# Patient Record
Sex: Female | Born: 1935 | Race: White | Hispanic: No | Smoking: Former smoker
Health system: Southern US, Community
[De-identification: ages and names within clinical notes are randomized; demographics above are authoritative.]

## PROBLEM LIST (undated history)

## (undated) DIAGNOSIS — E7800 Pure hypercholesterolemia, unspecified: Secondary | ICD-10-CM

## (undated) DIAGNOSIS — I10 Essential (primary) hypertension: Secondary | ICD-10-CM

## (undated) DIAGNOSIS — I4891 Unspecified atrial fibrillation: Secondary | ICD-10-CM

## (undated) HISTORY — PX: ANKLE SURGERY: SHX546

## (undated) HISTORY — PX: APPENDECTOMY: SHX54

## (undated) HISTORY — PX: CHOLECYSTECTOMY: SHX55

## (undated) HISTORY — PX: ABDOMINAL HYSTERECTOMY: SHX81

## (undated) HISTORY — PX: BACK SURGERY: SHX140

---

## 2000-02-28 ENCOUNTER — Ambulatory Visit (HOSPITAL_COMMUNITY): Admission: RE | Admit: 2000-02-28 | Discharge: 2000-02-28 | Payer: Self-pay | Admitting: Neurosurgery

## 2000-02-28 ENCOUNTER — Encounter: Payer: Self-pay | Admitting: Neurosurgery

## 2000-03-11 ENCOUNTER — Encounter: Payer: Self-pay | Admitting: Neurosurgery

## 2000-03-11 ENCOUNTER — Ambulatory Visit (HOSPITAL_COMMUNITY): Admission: RE | Admit: 2000-03-11 | Discharge: 2000-03-11 | Payer: Self-pay | Admitting: Neurosurgery

## 2000-03-25 ENCOUNTER — Ambulatory Visit (HOSPITAL_COMMUNITY): Admission: RE | Admit: 2000-03-25 | Discharge: 2000-03-25 | Payer: Self-pay | Admitting: Neurosurgery

## 2000-03-25 ENCOUNTER — Encounter: Payer: Self-pay | Admitting: Neurosurgery

## 2000-06-25 ENCOUNTER — Encounter: Payer: Self-pay | Admitting: Neurosurgery

## 2000-06-29 ENCOUNTER — Inpatient Hospital Stay (HOSPITAL_COMMUNITY): Admission: RE | Admit: 2000-06-29 | Discharge: 2000-06-30 | Payer: Self-pay | Admitting: Neurosurgery

## 2000-06-29 ENCOUNTER — Encounter: Payer: Self-pay | Admitting: Neurosurgery

## 2001-10-07 ENCOUNTER — Encounter: Admission: RE | Admit: 2001-10-07 | Discharge: 2001-11-03 | Payer: Self-pay | Admitting: Neurosurgery

## 2005-03-06 ENCOUNTER — Ambulatory Visit (HOSPITAL_COMMUNITY): Admission: RE | Admit: 2005-03-06 | Discharge: 2005-03-06 | Payer: Self-pay | Admitting: Neurosurgery

## 2008-03-13 ENCOUNTER — Emergency Department (HOSPITAL_COMMUNITY): Admission: EM | Admit: 2008-03-13 | Discharge: 2008-03-13 | Payer: Self-pay | Admitting: Emergency Medicine

## 2008-09-20 ENCOUNTER — Ambulatory Visit (HOSPITAL_COMMUNITY): Admission: RE | Admit: 2008-09-20 | Discharge: 2008-09-20 | Payer: Self-pay | Admitting: Internal Medicine

## 2010-06-10 LAB — POCT I-STAT, CHEM 8
BUN: 31 mg/dL — ABNORMAL HIGH (ref 6–23)
Calcium, Ion: 1.2 mmol/L (ref 1.12–1.32)
Chloride: 103 meq/L (ref 96–112)
Creatinine, Ser: 1 mg/dL (ref 0.4–1.2)
Glucose, Bld: 120 mg/dL — ABNORMAL HIGH (ref 70–99)
HCT: 40 % (ref 36.0–46.0)
Hemoglobin: 13.6 g/dL (ref 12.0–15.0)
Potassium: 4 meq/L (ref 3.5–5.1)
Sodium: 139 meq/L (ref 135–145)
TCO2: 29 mmol/L (ref 0–100)

## 2010-06-10 LAB — URINE MICROSCOPIC-ADD ON

## 2010-06-10 LAB — URINALYSIS, ROUTINE W REFLEX MICROSCOPIC
Bilirubin Urine: NEGATIVE
Glucose, UA: NEGATIVE mg/dL
Hgb urine dipstick: NEGATIVE
Ketones, ur: NEGATIVE mg/dL
Nitrite: NEGATIVE
Protein, ur: NEGATIVE mg/dL
Specific Gravity, Urine: 1.013 (ref 1.005–1.030)
Urobilinogen, UA: 0.2 mg/dL (ref 0.0–1.0)
pH: 6.5 (ref 5.0–8.0)

## 2010-06-10 LAB — CBC
Platelets: 347 10*3/uL (ref 150–400)
RBC: 4.35 MIL/uL (ref 3.87–5.11)
WBC: 9.3 10*3/uL (ref 4.0–10.5)

## 2010-06-10 LAB — DIFFERENTIAL
Basophils Absolute: 0.1 10*3/uL (ref 0.0–0.1)
Basophils Relative: 1 % (ref 0–1)
Eosinophils Absolute: 0.2 10*3/uL (ref 0.0–0.7)
Eosinophils Relative: 3 % (ref 0–5)
Lymphocytes Relative: 40 % (ref 12–46)
Lymphs Abs: 3.7 10*3/uL (ref 0.7–4.0)
Monocytes Absolute: 0.7 10*3/uL (ref 0.1–1.0)
Monocytes Relative: 8 % (ref 3–12)
Neutro Abs: 4.6 10*3/uL (ref 1.7–7.7)
Neutrophils Relative %: 50 % (ref 43–77)

## 2010-06-10 LAB — GLUCOSE, CAPILLARY: Glucose-Capillary: 132 mg/dL — ABNORMAL HIGH (ref 70–99)

## 2010-07-12 NOTE — H&P (Signed)
[facility]  Patient:    Penny Ramos, Penny Ramos                      MRN: [id number] Adm. Date:  [id number] Attending:  Barton Fanny                         History and Physical  HISTORY OF PRESENT ILLNESS:  The patient is a 77 year old right-handed white female whom I have followed for the past five months for neurogenic claudication secondary to lumbar stenosis.  Her symptoms began about eight months ago initially with left buttock and posterior thigh pain and it has extended to the buttocks and posterior thighs bilaterally.  She was treated with a number of measures including IM cortisone injections, a number of different NSAIDS including Vioxx and Celebrex that tended to upset her stomach even with the use of Prilosec.  She underwent a series of epidural steroid injections which gave her some transient relief but no lasting relief.  The pain has persisted and in fact has worsened, with pain into the low back, buttocks, posterior thighs, and calves, and at this point she finds it incapacitating.  She finds it difficult to sit for any extended period of time and difficult getting up from a seated position.  She cannot walk very far without pain incapacitating her and she can walk less than a block before pain starts radiating down to her lower extremities bilaterally, and she cannot walk more than two blocks without having to sit down and rest due to the claudication.  MRI scan shows significant stenosis at L4-5 although she does have degenerative disk disease and spondylosis throughout the lumbar spine. She is admitted now for an L4 and L5 lumbar laminectomy for decompression of her stenosis and relief of her claudication.  PAST MEDICAL HISTORY:  Notable for history of hypertension and hiatal hernia. There is no history, though, of myocardial infarction, cancer, stroke, diabetes, or lung disease.  PREVIOUS SURGERY:  Includes appendectomy in  1949, cholecystectomy in 1959, hysterectomy in 1971, two ankle surgeries in 1991 and a carpal tunnel release.  ALLERGIES:  She denies allergies to medications.  CURRENT MEDICATIONS: 1. Toprol-XL 100 mg q.d. 2. Zestoretic 20/25 one tablet p.o. q.d. 3. Premarin 0.625 mg q.d. 4. Lipitor 10 mg q.d. 5. Clorazepate 7.5 mg p.r.n. 6. Hydrocodone p.r.n. for pain.  FAMILY HISTORY:  Her parents have both passed on.  Her mother died at 46 from old age.  Her father died at age 68 of heart disease.  SOCIAL HISTORY:  The patient is married, she is retired.  She does not smoke. She does drink alcoholic beverages rarely.  She denies a history of substance abuse.  REVIEW OF SYSTEMS:  Notable for those difficulties described in her history of present illness and past medical history but is otherwise unremarkable.  PHYSICAL EXAMINATION:  GENERAL:  The patient is a well-developed, well-nourished white female in no acute distress.  VITAL SIGNS:  Her height is 5 feet 5.5 inches, weight 174 pounds.  She is afebrile.  LUNGS:  Clear to auscultation.  She has symmetrical respiratory excursion.  HEART:  Has a regular rate and rhythm, normal S1, S2.  There is no murmur.  ABDOMEN:  Soft, nondistended.  Bowel sounds are present.  EXTREMITIES:  Show no clubbing, cyanosis, or edema.  MUSCULOSKELETAL:  Shows mild tenderness to palpation in the lower lumbar region diffusely without any specific  point tenderness.  She is able to flex 90 degrees, able to extend fairly well.  Straight leg raising is negative bilaterally.  NEUROLOGIC:  Shows 5/5 strength through the distal left lower extremity including dorsiflexion, plantar flexion, extensor hallucis longus; however, she has giveaway weakness of the right dorsiflexion and plantar flexor due to her previous ankle surgery.  Her right extensor hallucis longus is 5/5. Sensation is intact to pinprick throughout the lower extremities.  Reflexes of the  quadriceps are 3 bilaterally; gastrocnemius on the left is absent, on the right is 1.  The toes are downgoing bilaterally.  She has a normal gait and stance although she cannot sit for an extended period of time.  IMPRESSION:  Patient with neurogenic claudication secondary to spinal stenosis, multifactorial in nature, at the L4-5 level with degenerative disk disease and spondylosis present throughout the lumbar spine.  PLAN:  The patient to be admitted for an L4 and L5 lumbar laminectomy.  We discussed the nature of surgery on several occasions; typical length of surgery, hospital stay, and overall recuperation; her limitations during the postoperative period; and alternatives to surgery.  We also discussed risks of surgery including risk of infection, bleeding, possible need for transfusion; the risk of nerve root dysfunction with pain, weakness, numbness, or paresthesias; the risk of dural tear and CSF leakage; the possible need for further surgery; and anesthetic risks of myocardial infarction, stroke, pneumonia, and death.  Understanding all this she does wish to proceed with surgery and is admitted for such. DD:  06/29/00 TD:  06/29/00 Job: [id number]

## 2010-07-12 NOTE — Op Note (Signed)
[place]  Patient:    Penny Ramos, Penny Ramos                      MRN: [id number] Proc. Date: 06/29/00 Adm. Date:  [id number] Attending:  Barton Fanny                           Operative Report  PREOPERATIVE DIAGNOSIS:  Lumbar stenosis.  POSTOPERATIVE DIAGNOSIS:  Lumbar stenosis.  PROCEDURE:  L4 and L5 lumbar laminectomy.  SURGEON:  Hewitt Shorts, M.D.  ASSISTANT:  Danae Orleans. Venetia Maxon, M.D.  ANESTHESIA:  General endotracheal.  INDICATION:  The patient is a 77 year old woman who presented with neurogenic claudication.  She had been treated with a number of measures, including NSAIDs, steroids, epidural steroid injections, and so on, without lasting relief, and a decision was made to proceed with a multilevel lumbar laminectomy.  DESCRIPTION OF PROCEDURE:  Patient brought to the operating room and placed under general endotracheal anesthesia.  The patient was turned to a prone position.  The lumbar region was prepped with Duraprep and draped in a sterile fashion.  The midline was infiltrated with local anesthetic with epinephrine and a localizing x-ray taken and the L5 level identified.  A midline incision was made, carried down through the subcutaneous tissue.  Bipolar cautery and electrocautery were used to maintain hemostasis.  Dissection was carried down through the lumbar fascia, which was incised bilaterally, and the paraspinal muscles were dissected from the spinous processes and laminae in subperiosteal fashion.  Another x-ray was taken, the L4-5 interlaminar space was identified, and then we proceeded with a multilevel lumbar laminectomy, removing the spinous processes and laminae of L4 and L5 using double-action rongeurs with Black Max drill and Kerrison punches.  Ligamentum flavum was markedly thickened, and we were able to decompress the thecal sac and the nerve roots within the foramina, and we were able to confirm  decompression of the L4, L5, and S1 nerve roots bilaterally.  Once the decompression was completed, hemostasis was established with the use of Gelfoam soaked in thrombin, and then the wound was irrigated extensively with bacitracin solution and then closed.  The paraspinal muscles were approximated with interrupted undyed 1 Vicryl sutures, the deep fascia closed with interrupted undyed 1 Vicryl sutures, and the subcutaneous and subcuticular closure with interrupted, inverted 2-0 undyed Vicryl sutures.  Skin was reapproximated with Dermabond. The patient tolerated the procedure well.  The estimated blood loss was less than 100 cc.  Sponge and needle count were correct.  Following surgery, the patient was turned back to supine position, to be reversed from the anesthetic, extubated, and transferred to the recovery room for further care. DD:  06/29/00 TD:  06/30/00 Job: [id number]

## 2011-03-10 ENCOUNTER — Other Ambulatory Visit: Payer: Self-pay | Admitting: Internal Medicine

## 2011-03-10 ENCOUNTER — Ambulatory Visit
Admission: RE | Admit: 2011-03-10 | Discharge: 2011-03-10 | Disposition: A | Discharge status: Home / Self Care | Payer: Medicare Other | Source: Ambulatory Visit | Attending: Internal Medicine | Admitting: Internal Medicine

## 2011-03-10 DIAGNOSIS — R52 Pain, unspecified: Secondary | ICD-10-CM

## 2012-11-29 ENCOUNTER — Encounter (HOSPITAL_COMMUNITY): Payer: Self-pay | Admitting: *Deleted

## 2012-11-29 ENCOUNTER — Emergency Department (HOSPITAL_COMMUNITY)
Admission: EM | Admit: 2012-11-29 | Discharge: 2012-11-29 | Disposition: A | Discharge status: Home / Self Care | Payer: Medicare Other | Attending: Emergency Medicine | Admitting: Emergency Medicine

## 2012-11-29 ENCOUNTER — Emergency Department (HOSPITAL_COMMUNITY): Payer: Medicare Other

## 2012-11-29 DIAGNOSIS — Z87891 Personal history of nicotine dependence: Secondary | ICD-10-CM | POA: Insufficient documentation

## 2012-11-29 DIAGNOSIS — E785 Hyperlipidemia, unspecified: Secondary | ICD-10-CM | POA: Insufficient documentation

## 2012-11-29 DIAGNOSIS — R002 Palpitations: Secondary | ICD-10-CM | POA: Insufficient documentation

## 2012-11-29 DIAGNOSIS — Z79899 Other long term (current) drug therapy: Secondary | ICD-10-CM | POA: Insufficient documentation

## 2012-11-29 DIAGNOSIS — I10 Essential (primary) hypertension: Secondary | ICD-10-CM | POA: Insufficient documentation

## 2012-11-29 HISTORY — DX: Essential (primary) hypertension: I10

## 2012-11-29 LAB — CBC
Hemoglobin: 13.8 g/dL (ref 12.0–15.0)
MCHC: 33.3 g/dL (ref 30.0–36.0)
WBC: 6.8 10*3/uL (ref 4.0–10.5)

## 2012-11-29 LAB — PRO B NATRIURETIC PEPTIDE: Pro B Natriuretic peptide (BNP): 272 pg/mL (ref 0–450)

## 2012-11-29 LAB — BASIC METABOLIC PANEL
Chloride: 102 mEq/L (ref 96–112)
GFR calc Af Amer: 71 mL/min — ABNORMAL LOW (ref >90)
GFR calc non Af Amer: 62 mL/min — ABNORMAL LOW (ref >90)
Glucose, Bld: 105 mg/dL — ABNORMAL HIGH (ref 70–99)
Potassium: 3.4 mEq/L — ABNORMAL LOW (ref 3.5–5.1)
Sodium: 142 mEq/L (ref 135–145)

## 2012-11-29 LAB — POCT I-STAT TROPONIN I

## 2012-11-29 MED ORDER — POTASSIUM CHLORIDE CRYS ER 20 MEQ PO TBCR
20.0000 meq | EXTENDED_RELEASE_TABLET | Freq: Once | ORAL | Status: AC
  Administered 2012-11-29: 20 meq via ORAL
  Filled 2012-11-29: qty 1

## 2012-11-29 NOTE — ED Notes (Signed)
Pt ambulated with assistance from her cane and myself to the bathroom and back to her room; family at bedside; pt placed back on monitor, continuous pulse oximetry and blood pressure cuff; PA is now at bedside

## 2012-11-29 NOTE — ED Notes (Signed)
Pt reports being woken this morning with feeling of palpatations in the center of her chest.  This episode lasted approx 15 min then stopped. Pt has had another episode of same on Saturday.  Pt alert oriented X4

## 2012-11-29 NOTE — ED Notes (Signed)
"  my heart runs, sometimes it beats and sometimes it races."  Denies chest pain.  Shortness of breathe with "alot of walking."  Leg pain- both thighs- "I am seeing an orthopedic for this."

## 2012-11-29 NOTE — ED Notes (Signed)
Patient transported to X-ray 

## 2012-11-29 NOTE — ED Notes (Signed)
Pt states at 0500 today was woke with palpitations that scared her.  No sob.  Pt had some weakness on Saturday

## 2012-11-29 NOTE — ED Provider Notes (Signed)
Medical screening examination/treatment/procedure(s) were conducted as a shared visit with non-physician practitioner(s) and myself.  I personally evaluated the patient during the encounter  Patient seen and examined. Describes that she has had trouble with shortness of breath with ambulation for quite some time. Denies any chest pain or CHF type symptoms. No syncope or near syncope with her palpitations. Denies any recent vomiting or fever. Labs reviewed here. She is stable for discharge  Toy Baker, MD 11/29/12 1323

## 2012-11-29 NOTE — ED Provider Notes (Signed)
CSN: [id number]     Arrival date & time 11/29/12  0909 History   First MD Initiated Contact with Patient 11/29/12 1107     Chief Complaint  Patient presents with  . Palpitations   (Consider location/radiation/quality/duration/timing/severity/associated sxs/prior Treatment) HPI Comments: Patient is a 79 yo F PMHx significant for HTN, HLD presenting to the ED for two episodes of palpitations that began this morning around 5AM. Patient states she woke up to go to the restroom and after ambulating began to feel like her "heart was racing." Patient states she sat down and the palpitations passed after about 10 minutes. Patient states she had one more episode of palpitations that began with exertion and resolved after 10 minutes of rest. Patient denies having any CP, SOB, nausea, vomiting, diaphoresis with the palpitations. The only associated symptom the patient endorses is feeling very anxious after the onset of palpitations Patient states that this weekend she had new DOE with walking from her garage into her house. Patient denies ever experiencing this before, but staets she is not very ambulatory d/t lower extremity arthritis. Patient walks with cane normally. Patient never had a cardiac catheterization, echo, stress test.  Patient is a 79 y.o. female presenting with palpitations.  Palpitations Associated symptoms: no nausea, no shortness of breath and no vomiting     Past Medical History  Diagnosis Date  . Hypertension    Past Surgical History  Procedure Laterality Date  . Cholecystectomy    . Abdominal hysterectomy    . Back surgery    . Ankle surgery    . Appendectomy     No family history on file. History  Substance Use Topics  . Smoking status: Former Games developer  . Smokeless tobacco: Not on file  . Alcohol Use: No   OB History   Grav Para Term Preterm Abortions TAB SAB Ect Mult Living                 Review of Systems  Constitutional: Negative for fever.  HENT: Negative.    Eyes: Negative.   Respiratory: Negative for shortness of breath.   Cardiovascular: Positive for palpitations.  Gastrointestinal: Negative for nausea, vomiting and abdominal pain.  Genitourinary: Negative.   Musculoskeletal: Negative.   Skin: Negative.   Neurological: Negative for syncope and headaches.    Allergies  Nsaids  Home Medications   Current Outpatient Rx  Name  Route  Sig  Dispense  Refill  . acetaminophen (TYLENOL) 500 MG tablet   Oral   Take 1,000 mg by mouth every 6 (six) hours as needed for pain.         Marland Kitchen atorvastatin (LIPITOR) 10 MG tablet   Oral   Take 10 mg by mouth daily.         . clorazepate (TRANXENE) 7.5 MG tablet   Oral   Take 7.5 mg by mouth daily as needed.         Marland Kitchen lisinopril-hydrochlorothiazide (PRINZIDE,ZESTORETIC) 20-25 MG per tablet   Oral   Take 1 tablet by mouth daily.         . metoprolol succinate (TOPROL-XL) 100 MG 24 hr tablet   Oral   Take 100 mg by mouth daily. Take with or immediately following a meal.         . traMADol (ULTRAM) 50 MG tablet   Oral   Take 50 mg by mouth every 6 (six) hours as needed for pain.          BP 156/88  Pulse 79  Temp(Src) 97.4 F (36.3 C) (Oral)  Resp 23  SpO2 96% Physical Exam  Constitutional: She is oriented to person, place, and time. She appears well-developed and well-nourished. No distress.  HENT:  Head: Normocephalic and atraumatic.  Right Ear: External ear normal.  Left Ear: External ear normal.  Nose: Nose normal.  Mouth/Throat: Oropharynx is clear and moist.  Eyes: Conjunctivae are normal. Pupils are equal, round, and reactive to light.  Neck: Normal range of motion. Neck supple.  Cardiovascular: Normal rate, regular rhythm, normal heart sounds and intact distal pulses.   No murmur heard. No carotid bruits   Pulmonary/Chest: Effort normal and breath sounds normal. No respiratory distress. She exhibits no tenderness.  Abdominal: Soft. Bowel sounds are normal. She  exhibits no distension. There is no tenderness. There is no rebound.  Musculoskeletal: Normal range of motion. She exhibits no edema and no tenderness.  Lymphadenopathy:    She has no cervical adenopathy.  Neurological: She is alert and oriented to person, place, and time.  Skin: Skin is warm and dry. She is not diaphoretic.    ED Course  Procedures (including critical care time) Labs Review Labs Reviewed  BASIC METABOLIC PANEL - Abnormal; Notable for the following:    Potassium 3.4 (*)    Glucose, Bld 105 (*)    BUN 24 (*)    GFR calc non Af Amer 62 (*)    GFR calc Af Amer 71 (*)    All other components within normal limits  CBC  PRO B NATRIURETIC PEPTIDE  POCT I-STAT TROPONIN I    Date: 11/29/2012  Rate: 77  Rhythm: normal sinus rhythm and premature atrial contractions (PAC)  QRS Axis: normal  Intervals: normal  ST/T Wave abnormalities: nonspecific ST/T changes  Conduction Disutrbances:none  Narrative Interpretation:   Old EKG Reviewed: none available   Imaging Review Dg Chest 2 View  11/29/2012   CLINICAL DATA:  Mid chest pain.  EXAM: CHEST  2 VIEW  COMPARISON:  None  FINDINGS: Normal cardiac and mediastinal contours. No consolidative pulmonary opacities. No pleural effusion pneumothorax. Mid thoracic spine degenerative change.  IMPRESSION: No acute cardiopulmonary process.   Electronically Signed   By: Annia Belt M.D.   On: 11/29/2012 12:59    MDM   1. Palpitations   2. Hypertension    Afebrile, NAD, non-toxic appearing, AAOx4. Patient presented with 2 episodes of palpitations without associated shortness of breath, chest pain, dizziness, lightheadedness, loss of consciousness. Patient has been asymptomatic since arriving in the emergency department. Physical exam is unremarkable. Lungs are clear to auscultation bilaterally. Regular heart rate and rhythm with intact distal pulses. Troponin negative. EKG without acute abnormality. I have personally reviewed all EKG,  laboratory and imaging results. At this time do not feel that there is any acute emergent cause of patient's palpitations. Advised patient to followup with primary care physician regarding today's visit. Advised patient to continue taking all home medications as prescribed. Return precautions discussed with patient. Patient is agreeable to plan. Patient is stable at time of discharge. Patient d/w with Dr. Freida Busman, agrees with plan.        Jeannetta Ellis, PA-C 11/29/12 1927

## 2012-11-30 NOTE — ED Provider Notes (Signed)
Medical screening examination/treatment/procedure(s) were conducted as a shared visit with non-physician practitioner(s) and myself.  I personally evaluated the patient during the encounter  Wataru Mccowen T Kebrina Friend, MD 11/30/12 2332 

## 2013-12-07 ENCOUNTER — Other Ambulatory Visit: Payer: Self-pay | Admitting: Internal Medicine

## 2013-12-07 ENCOUNTER — Ambulatory Visit
Admission: RE | Admit: 2013-12-07 | Discharge: 2013-12-07 | Disposition: A | Discharge status: Home / Self Care | Payer: Medicare Other | Source: Ambulatory Visit | Attending: Internal Medicine | Admitting: Internal Medicine

## 2013-12-07 DIAGNOSIS — M25521 Pain in right elbow: Secondary | ICD-10-CM

## 2013-12-27 ENCOUNTER — Ambulatory Visit: Payer: Medicare Other | Admitting: Physical Therapy

## 2014-10-09 ENCOUNTER — Emergency Department (HOSPITAL_COMMUNITY): Payer: Medicare Other

## 2014-10-09 ENCOUNTER — Encounter (HOSPITAL_COMMUNITY): Payer: Self-pay

## 2014-10-09 ENCOUNTER — Observation Stay (HOSPITAL_COMMUNITY)
Admission: EM | Admit: 2014-10-09 | Discharge: 2014-10-10 | Disposition: A | Discharge status: Home / Self Care | Payer: Medicare Other | Attending: Cardiology | Admitting: Cardiology

## 2014-10-09 DIAGNOSIS — N183 Chronic kidney disease, stage 3 (moderate): Secondary | ICD-10-CM | POA: Insufficient documentation

## 2014-10-09 DIAGNOSIS — E785 Hyperlipidemia, unspecified: Secondary | ICD-10-CM | POA: Insufficient documentation

## 2014-10-09 DIAGNOSIS — Z79899 Other long term (current) drug therapy: Secondary | ICD-10-CM | POA: Diagnosis not present

## 2014-10-09 DIAGNOSIS — I4891 Unspecified atrial fibrillation: Principal | ICD-10-CM | POA: Diagnosis present

## 2014-10-09 DIAGNOSIS — I129 Hypertensive chronic kidney disease with stage 1 through stage 4 chronic kidney disease, or unspecified chronic kidney disease: Secondary | ICD-10-CM | POA: Diagnosis not present

## 2014-10-09 DIAGNOSIS — I5031 Acute diastolic (congestive) heart failure: Secondary | ICD-10-CM | POA: Diagnosis not present

## 2014-10-09 DIAGNOSIS — K219 Gastro-esophageal reflux disease without esophagitis: Secondary | ICD-10-CM | POA: Diagnosis not present

## 2014-10-09 DIAGNOSIS — I10 Essential (primary) hypertension: Secondary | ICD-10-CM | POA: Diagnosis present

## 2014-10-09 DIAGNOSIS — Z87891 Personal history of nicotine dependence: Secondary | ICD-10-CM | POA: Diagnosis not present

## 2014-10-09 DIAGNOSIS — R739 Hyperglycemia, unspecified: Secondary | ICD-10-CM | POA: Insufficient documentation

## 2014-10-09 HISTORY — DX: Pure hypercholesterolemia, unspecified: E78.00

## 2014-10-09 LAB — BASIC METABOLIC PANEL
Anion gap: 13 (ref 5–15)
BUN: 31 mg/dL — AB (ref 6–20)
CHLORIDE: 105 mmol/L (ref 101–111)
CO2: 24 mmol/L (ref 22–32)
CREATININE: 1.04 mg/dL — AB (ref 0.44–1.00)
Calcium: 9.9 mg/dL (ref 8.9–10.3)
GFR calc Af Amer: 56 mL/min — ABNORMAL LOW (ref >60)
GFR calc non Af Amer: 48 mL/min — ABNORMAL LOW (ref >60)
GLUCOSE: 165 mg/dL — AB (ref 65–99)
POTASSIUM: 3.6 mmol/L (ref 3.5–5.1)
SODIUM: 142 mmol/L (ref 135–145)

## 2014-10-09 LAB — TROPONIN I
TROPONIN I: 0.03 ng/mL (ref <0.031)
Troponin I: 0.04 ng/mL — ABNORMAL HIGH (ref <0.031)
Troponin I: 0.04 ng/mL — ABNORMAL HIGH (ref <0.031)
Troponin I: 0.04 ng/mL — ABNORMAL HIGH (ref <0.031)

## 2014-10-09 LAB — CBC WITH DIFFERENTIAL/PLATELET
BASOS PCT: 0 % (ref 0–1)
Basophils Absolute: 0 10*3/uL (ref 0.0–0.1)
Eosinophils Absolute: 0.1 10*3/uL (ref 0.0–0.7)
Eosinophils Relative: 1 % (ref 0–5)
HEMATOCRIT: 39 % (ref 36.0–46.0)
HEMOGLOBIN: 12.3 g/dL (ref 12.0–15.0)
LYMPHS ABS: 2.7 10*3/uL (ref 0.7–4.0)
LYMPHS PCT: 29 % (ref 12–46)
MCH: 29.8 pg (ref 26.0–34.0)
MCHC: 31.5 g/dL (ref 30.0–36.0)
MCV: 94.4 fL (ref 78.0–100.0)
MONO ABS: 0.5 10*3/uL (ref 0.1–1.0)
MONOS PCT: 5 % (ref 3–12)
NEUTROS ABS: 5.9 10*3/uL (ref 1.7–7.7)
NEUTROS PCT: 65 % (ref 43–77)
Platelets: 344 10*3/uL (ref 150–400)
RBC: 4.13 MIL/uL (ref 3.87–5.11)
RDW: 12.5 % (ref 11.5–15.5)
WBC: 9.1 10*3/uL (ref 4.0–10.5)

## 2014-10-09 LAB — BRAIN NATRIURETIC PEPTIDE: B NATRIURETIC PEPTIDE 5: 332.9 pg/mL — AB (ref 0.0–100.0)

## 2014-10-09 LAB — URINALYSIS, ROUTINE W REFLEX MICROSCOPIC
Bilirubin Urine: NEGATIVE
GLUCOSE, UA: NEGATIVE mg/dL
Hgb urine dipstick: NEGATIVE
KETONES UR: NEGATIVE mg/dL
LEUKOCYTES UA: NEGATIVE
NITRITE: NEGATIVE
PH: 5 (ref 5.0–8.0)
Protein, ur: NEGATIVE mg/dL
SPECIFIC GRAVITY, URINE: 1.023 (ref 1.005–1.030)
Urobilinogen, UA: 0.2 mg/dL (ref 0.0–1.0)

## 2014-10-09 LAB — PROTIME-INR
INR: 0.97 (ref 0.00–1.49)
Prothrombin Time: 13.1 seconds (ref 11.6–15.2)

## 2014-10-09 LAB — TSH: TSH: 1.191 u[IU]/mL (ref 0.350–4.500)

## 2014-10-09 MED ORDER — DILTIAZEM HCL 100 MG IV SOLR
5.0000 mg/h | INTRAVENOUS | Status: DC
  Administered 2014-10-09: 5 mg/h via INTRAVENOUS
  Filled 2014-10-09: qty 100

## 2014-10-09 MED ORDER — LORAZEPAM 2 MG/ML IJ SOLN
0.5000 mg | Freq: Once | INTRAMUSCULAR | Status: AC
  Administered 2014-10-09: 0.5 mg via INTRAVENOUS
  Filled 2014-10-09: qty 1

## 2014-10-09 MED ORDER — DILTIAZEM HCL ER COATED BEADS 240 MG PO CP24: 240.0000 mg | ORAL_CAPSULE | Freq: Every day | ORAL | Status: DC

## 2014-10-09 MED ORDER — DILTIAZEM LOAD VIA INFUSION
20.0000 mg | Freq: Once | INTRAVENOUS | Status: AC
  Administered 2014-10-09: 20 mg via INTRAVENOUS
  Filled 2014-10-09: qty 20

## 2014-10-09 MED ORDER — DILTIAZEM HCL ER COATED BEADS 240 MG PO CP24
240.0000 mg | ORAL_CAPSULE | Freq: Once | ORAL | Status: AC
  Administered 2014-10-09: 240 mg via ORAL
  Filled 2014-10-09: qty 1

## 2014-10-09 MED ORDER — DILTIAZEM HCL ER COATED BEADS 180 MG PO CP24
180.0000 mg | ORAL_CAPSULE | Freq: Every day | ORAL | Status: DC
  Administered 2014-10-10: 180 mg via ORAL
  Filled 2014-10-09: qty 1

## 2014-10-09 MED ORDER — RIVAROXABAN 15 MG PO TABS
15.0000 mg | ORAL_TABLET | Freq: Every day | ORAL | Status: DC
  Administered 2014-10-09: 15 mg via ORAL
  Filled 2014-10-09 (×2): qty 1

## 2014-10-09 MED ORDER — HEPARIN BOLUS VIA INFUSION
4000.0000 [IU] | Freq: Once | INTRAVENOUS | Status: AC
  Administered 2014-10-09: 4000 [IU] via INTRAVENOUS
  Filled 2014-10-09: qty 4000

## 2014-10-09 MED ORDER — CLORAZEPATE DIPOTASSIUM 3.75 MG PO TABS
7.5000 mg | ORAL_TABLET | Freq: Four times a day (QID) | ORAL | Status: DC
  Administered 2014-10-09 – 2014-10-10 (×4): 7.5 mg via ORAL
  Filled 2014-10-09 (×4): qty 2

## 2014-10-09 MED ORDER — ATORVASTATIN CALCIUM 10 MG PO TABS
10.0000 mg | ORAL_TABLET | Freq: Every day | ORAL | Status: DC
  Administered 2014-10-09 – 2014-10-10 (×2): 10 mg via ORAL
  Filled 2014-10-09 (×2): qty 1

## 2014-10-09 MED ORDER — HEPARIN (PORCINE) IN NACL 100-0.45 UNIT/ML-% IJ SOLN
1000.0000 [IU]/h | INTRAMUSCULAR | Status: DC
  Administered 2014-10-09: 1000 [IU]/h via INTRAVENOUS
  Filled 2014-10-09: qty 250

## 2014-10-09 MED ORDER — HEPARIN (PORCINE) IN NACL 100-0.45 UNIT/ML-% IJ SOLN
1000.0000 [IU]/h | INTRAMUSCULAR | Status: DC
  Filled 2014-10-09: qty 250

## 2014-10-09 MED ORDER — METOPROLOL SUCCINATE ER 100 MG PO TB24
100.0000 mg | ORAL_TABLET | Freq: Every day | ORAL | Status: DC
  Administered 2014-10-09 – 2014-10-10 (×2): 100 mg via ORAL
  Filled 2014-10-09 (×2): qty 1

## 2014-10-09 NOTE — ED Provider Notes (Signed)
Medical screening examination/treatment/procedure(s) were conducted as a shared visit with non-physician practitioner(s) and myself.  I personally evaluated the patient during the encounter.   EKG Interpretation   Date/Time:  Monday October 09 2014 07:44:44 EDT Ventricular Rate:  175 PR Interval:    QRS Duration: 74 QT Interval:  272 QTC Calculation: 464 R Axis:   14 Text Interpretation:  Atrial fibrillation with rapid V-rate Repolarization  abnormality, prob rate related Confirmed by Derin Matthes  MD, Essa Malachi ([id number]) on  10/09/2014 8:00:46 AM      86yF with new onset afib. Rate improving with cardizem. Remains in afib, but feeling better. Home meds include metoprolol  daily. CHADSVASC score of 4. Age, female, hx of HTN. Will wait for labs to result. Will discuss with cards concerning anticoagulation and other medications recommendations.   Raeford Razor, MD 10/13/14 [phone]

## 2014-10-09 NOTE — ED Provider Notes (Signed)
CSN: [id number]     Arrival date & time 10/09/14  0730 History   First MD Initiated Contact with Patient 10/09/14 [phone]     Chief Complaint  Patient presents with  . Palpitations   Penny Ramos is a 81 y.o. female with a history of hypertension and hyperlipidemia who presents to the emergency department complaining of palpitations since 4:30 this morning. Patient reports she woke up around 4:30 this morning her heart was racing. She denies any chest pain or shortness of breath. She denies any history of atrial fibrillation. The patient's son reports that she has a history of palpitations but no history of atrial fibrillation. Patient is not on anticoagulants. The patient is not followed by a cardiologist. The patient denies fevers, chills, recent illness, cough, wheezing, shortness of breath, chest pain, chest tightness, abdominal pain, nausea, vomiting, lightheadedness, or leg swelling.   (Consider location/radiation/quality/duration/timing/severity/associated sxs/prior Treatment) HPI  Past Medical History  Diagnosis Date  . Hypertension   . High cholesterol    Past Surgical History  Procedure Laterality Date  . Cholecystectomy    . Abdominal hysterectomy    . Back surgery    . Ankle surgery    . Appendectomy     History reviewed. No pertinent family history. Social History  Substance Use Topics  . Smoking status: Former Games developer  . Smokeless tobacco: None  . Alcohol Use: No   OB History    No data available     Review of Systems  Constitutional: Negative for fever and chills.  HENT: Negative for congestion and sore throat.   Eyes: Negative for visual disturbance.  Respiratory: Negative for cough, shortness of breath and wheezing.   Cardiovascular: Positive for palpitations. Negative for chest pain and leg swelling.  Gastrointestinal: Negative for nausea, vomiting and abdominal pain.  Genitourinary: Negative for dysuria.  Musculoskeletal: Negative for back pain and neck  pain.  Skin: Negative for rash.  Neurological: Negative for dizziness, syncope, weakness, light-headedness, numbness and headaches.      Allergies  Nsaids  Home Medications   Prior to Admission medications   Medication Sig Start Date End Date Taking? Authorizing Provider  acetaminophen (TYLENOL) 500 MG tablet Take 500-1,000 mg by mouth every 6 (six) hours as needed for mild pain, moderate pain, fever or headache.    Yes Historical Provider, MD  atorvastatin (LIPITOR) 10 MG tablet Take 10 mg by mouth daily.   Yes Historical Provider, MD  clorazepate (TRANXENE) 7.5 MG tablet Take 7.5 mg by mouth 4 (four) times daily.  11/09/12  Yes Historical Provider, MD  lactose free nutrition (BOOST) LIQD Take 237 mLs by mouth daily.   Yes Historical Provider, MD  lisinopril-hydrochlorothiazide (PRINZIDE,ZESTORETIC) 20-25 MG per tablet Take 1 tablet by mouth daily.   Yes Historical Provider, MD  metoprolol succinate (TOPROL-XL) 100 MG 24 hr tablet Take 100 mg by mouth daily. Take with or immediately following a meal.   Yes Historical Provider, MD   BP 130/76 mmHg  Pulse 56  Temp(Src) 97.8 F (36.6 C) (Oral)  Resp 24  Ht 5\' 4"  (1.626 m)  Wt 155 lb (70.308 kg)  BMI 26.59 kg/m2  SpO2 94% Physical Exam  Constitutional: She is oriented to person, place, and time. She appears well-developed and well-nourished. No distress.  Nontoxic appearing.  HENT:  Head: Normocephalic and atraumatic.  Mouth/Throat: Oropharynx is clear and moist.  Eyes: Conjunctivae are normal. Pupils are equal, round, and reactive to light. Right eye exhibits no discharge. Left eye  exhibits no discharge.  Neck: Normal range of motion. Neck supple. No JVD present. No tracheal deviation present.  Cardiovascular: Normal heart sounds and intact distal pulses.  Exam reveals no gallop and no friction rub.   No murmur heard. HR in 170s and a-fib RVR on monitor. Bilateral radial, posterior tibialis and dorsalis pedis pulses are intact.     Pulmonary/Chest: Effort normal and breath sounds normal. No respiratory distress. She has no wheezes. She has no rales.  Lungs clear to ascultation bilaterally. No respiratory distress.   Abdominal: Soft. She exhibits no distension. There is no tenderness. There is no guarding.  Musculoskeletal: She exhibits no edema or tenderness.  No lower extremity edema or tenderness.   Lymphadenopathy:    She has no cervical adenopathy.  Neurological: She is alert and oriented to person, place, and time. Coordination normal.  She is alert and oriented x3.   Skin: Skin is warm and dry. No rash noted. She is not diaphoretic. No erythema. No pallor.  Psychiatric: She has a normal mood and affect. Her behavior is normal.  Nursing note and vitals reviewed.   ED Course  Procedures (including critical care time) Labs Review Labs Reviewed  BASIC METABOLIC PANEL - Abnormal; Notable for the following:    Glucose, Bld 165 (*)    BUN 31 (*)    Creatinine, Ser 1.04 (*)    GFR calc non Af Amer 48 (*)    GFR calc Af Amer 56 (*)    All other components within normal limits  BRAIN NATRIURETIC PEPTIDE - Abnormal; Notable for the following:    B Natriuretic Peptide 332.9 (*)    All other components within normal limits  TROPONIN I - Abnormal; Notable for the following:    Troponin I 0.04 (*)    All other components within normal limits  CBC WITH DIFFERENTIAL/PLATELET  PROTIME-INR  TSH  URINALYSIS, ROUTINE W REFLEX MICROSCOPIC (NOT AT [facility])  TROPONIN I    Imaging Review Dg Chest Port 1 View  10/09/2014   CLINICAL DATA:  Atrial fibrillation with cardiac palpitations  EXAM: PORTABLE CHEST - 1 VIEW  COMPARISON:  November 29, 2012  FINDINGS: There is mild scarring in the left base. There is no edema or consolidation. The heart is mildly enlarged with pulmonary vascularity within normal limits. No adenopathy. There is atherosclerotic change in the aorta. There is calcification in both carotid arteries.   IMPRESSION: Scarring left base. No edema or consolidation. Stable cardiac prominence. Calcification noted in each carotid artery.   Electronically Signed   By: Bretta Bang III M.D.   On: 10/09/2014 08:20   I, Lawana Chambers, personally reviewed and evaluated these images and lab results as part of my medical decision-making.   EKG Interpretation   Date/Time:  Monday October 09 2014 07:44:44 EDT Ventricular Rate:  175 PR Interval:    QRS Duration: 74 QT Interval:  272 QTC Calculation: 464 R Axis:   14 Text Interpretation:  Atrial fibrillation with rapid V-rate Repolarization  abnormality, prob rate related Confirmed by Juleen China  MD, STEPHEN ([id number]) on  10/09/2014 8:00:46 AM      Filed Vitals:   10/09/14 1007 10/09/14 1015 10/09/14 1017 10/09/14 1109  BP: 125/71  130/76   Pulse: 113 62 56   Temp:      TempSrc:      Resp: 21 16 24    Height:    5\' 4"  (1.626 m)  Weight:    155 lb (70.308 kg)  SpO2:  93% 94% 94%      MDM   Meds given in ED:  Medications  diltiazem (CARDIZEM) 100 mg in dextrose 5 % 100 mL (1 mg/mL) infusion (5 mg/hr Intravenous New Bag/Given 10/09/14 0823)  diltiazem (CARDIZEM CD) 24 hr capsule 240 mg (not administered)  diltiazem (CARDIZEM) 1 mg/mL load via infusion 20 mg (20 mg Intravenous Given 10/09/14 0829)    New Prescriptions   No medications on file    Final diagnoses:  Atrial fibrillation with RVR   This is a 81 y.o. female with a history of hypertension and hyperlipidemia who presents to the emergency department complaining of palpitations since 4:30 this morning. Patient reports she woke up around 4:30 this morning her heart was racing. She denies any chest pain or shortness of breath. She denies any history of atrial fibrillation. Upon arrival to the emergency department the patient was in A. fib RVR with a heart rate in the 170s. Patient's blood pressure stable 133/95. She is alert and oriented. She denies any shortness of breath. Her lungs  were clear to auscultation bilaterally. She has no lower extremity edema or tenderness. Patient was given Cardizem 20 mg bolus and started on a rate of 5 mg/hr. after patient received bolus patient's heart rate stabilized into the 90-100s. Patient still remained in atrial fibrillation. Patient reports her palpitations improved. BMP is unremarkable. BNP is mildly elevated at 332. Troponin is normally elevated at 0.04. CBC is unremarkable. INR 0.97. She has a TSH of 1.191. I consulted with Cardiologist Dr. Jacinto Halim who would like the patient transitioned to oral cardizem and started on heparin drip for her elevated troponin. He would like her transferred to [facility] for admission to a telemetry bed. Temporary admission orders placed for telemetry. The patient is in agreement with admission.  This patient was discussed with and evaluated by Dr. Juleen China who agrees with assessment and plan.   CRITICAL CARE Performed by: Lawana Chambers   Total critical care time: 40  Critical care time was exclusive of separately billable procedures and treating other patients.  Critical care was necessary to treat or prevent imminent or life-threatening deterioration.  Critical care was time spent personally by me on the following activities: development of treatment plan with patient and/or surrogate as well as nursing, discussions with consultants, evaluation of patient's response to treatment, examination of patient, obtaining history from patient or surrogate, ordering and performing treatments and interventions, ordering and review of laboratory studies, ordering and review of radiographic studies, pulse oximetry and re-evaluation of patient's condition.      Everlene Farrier, PA-C 10/09/14 1652  Raeford Razor, MD 10/13/14 [phone]

## 2014-10-09 NOTE — Progress Notes (Signed)
ANTICOAGULATION CONSULT NOTE - Initial Consult  Pharmacy Consult for heparin Indication: atrial fibrillation  Allergies  Allergen Reactions  . Nsaids Nausea And Vomiting    Patient Measurements: weight 70 kg, height 64 inches   Heparin Dosing Weight: 70 kg  Vital Signs: Temp: 97.8 F (36.6 C) (08/15 0807) Temp Source: Oral (08/15 0807) BP: 130/76 mmHg (08/15 1017) Pulse Rate: 56 (08/15 1017)  Labs:  Recent Labs  10/09/14 0803  HGB 12.3  HCT 39.0  PLT 344  LABPROT 13.1  INR 0.97  CREATININE 1.04*  TROPONINI 0.04*    CrCl cannot be calculated (Unknown ideal weight.).   Medical History: Past Medical History  Diagnosis Date  . Hypertension   . High cholesterol     Medications:  See med rec  Assessment: Patient's an 81 y.o F presenting to the ED with c/o palpitations.  To start heparin for afib with RVR.  Goal of Therapy:  Heparin level 0.3-0.7 units/ml Monitor platelets by anticoagulation protocol: Yes   Plan:  - heparin 4000 Units IV x1 bolus, then drip at 1000 units/hr - check 8 hour heparin level   Emmitt Matthews P 10/09/2014,10:51 AM

## 2014-10-09 NOTE — ED Notes (Signed)
Pt c/o heart palpitations starting around 0430.  Denies pain.  No cardiac Hx.

## 2014-10-09 NOTE — Progress Notes (Signed)
ANTICOAGULATION CONSULT NOTE - Initial Consult  Pharmacy Consult for heparin Indication: atrial fibrillation  Allergies  Allergen Reactions  . Strawberry Anaphylaxis  . Nsaids Nausea And Vomiting    Patient Measurements: weight 70 kg, height 64 inches Height:  (162.6 cm) Weight: 155 lb (70.308 kg) IBW/kg (Calculated) : 54.7 Heparin Dosing Weight: 70 kg  Vital Signs: Temp: 98 F (36.7 C) (08/15 1351) Temp Source: Oral (08/15 1351) BP: 168/85 mmHg (08/15 1351) Pulse Rate: 89 (08/15 1351)  Labs:  Recent Labs  10/09/14 0803 10/09/14 1122  HGB 12.3  --   HCT 39.0  --   PLT 344  --   LABPROT 13.1  --   INR 0.97  --   CREATININE 1.04*  --   TROPONINI 0.04* 0.03    Estimated Creatinine Clearance: 38.7 mL/min (by C-G formula based on Cr of 1.04).   Medical History: Past Medical History  Diagnosis Date  . Hypertension   . High cholesterol     Assessment: Patient's an 81 y.o F presenting to the ED with c/o palpitations.  To start heparin for afib with RVR. Consult already completed by pharmacist at [facility] Will not bolus as patient received the bolus at [facility] ED. Will start back at last drip rate.   Goal of Therapy:  Heparin level 0.3-0.7 units/ml Monitor platelets by anticoagulation protocol: Yes   Plan:  - heparin 4000 Units IV x1 bolus (completed at [facility] ED) - heparin 1000 units/hr - check 8 hour heparin level   Juanita Craver A 10/09/2014,1:58 PM

## 2014-10-09 NOTE — H&P (Signed)
Penny Ramos is an 81 y.o. female.   Chief Complaint: Palpitations HPI: Penny Ramos  is a 81 y.o. female  With history of hypertension, hyperlipidemia, who lives independently at home, woke up at 4 AM with palpitations.  She didn't feel well, waited till 7 AM and called her son and eventually presented to the emergency room where she was found to be in atrial fibrillation with rapid ventricular response.  This is associated with mild chest discomfort.  Otherwise she did well, had no shortness of breath, no dizziness or syncope.  Patient is presently feeling well and has no palpitations.  Denies any chest pain, shortness of breath.  Denies any chronic cough, recent weight changes.  She is not diabetic.  She denies any headache, visual disturbances, any neurologic deficits.  There is no history of GI bleed, she has had some gait imbalance but no history of fall.  Her son is present at the bedside.  Past Medical History  Diagnosis Date  . Hypertension   . High cholesterol     Past Surgical History  Procedure Laterality Date  . Cholecystectomy    . Abdominal hysterectomy    . Back surgery    . Ankle surgery    . Appendectomy      History reviewed. No pertinent family history. Social History:  reports that she has quit smoking. She does not have any smokeless tobacco history on file. She reports that she does not drink alcohol or use illicit drugs.  Allergies:  Allergies  Allergen Reactions  . Strawberry Anaphylaxis  . Nsaids Nausea And Vomiting    Review of Systems - has chronic back pain.  Has degenerative joint disease in her hands and also knee.  Other systems negative.  Blood pressure 135/64, pulse 77, temperature 98.2 F (36.8 C), temperature source Oral, resp. rate 18, height $RemoveBe'5\' 4"'xJdgLLGiq$  (1.626 m), weight 70.308 kg (155 lb), SpO2 97 %. General appearance: alert, cooperative, appears stated age, no distress and mildly obese Eyes: negative findings: lids and lashes  normal Neck: no adenopathy, no carotid bruit, no JVD, supple, symmetrical, trachea midline and thyroid not enlarged, symmetric, no tenderness/mass/nodules Neck: JVP - normal, carotids 2+= without bruits Resp: there is coarse crackles at both the lung bases. Chest wall: no tenderness Cardio: regular rate and rhythm, S1, S2 normal, no murmur, click, rub or gallop GI: soft, non-tender; bowel sounds normal; no masses,  no organomegaly and Obese Extremities: extremities normal, atraumatic, no cyanosis or edema Pulses: 2+ and symmetric Skin: Skin color, texture, turgor normal. No rashes or lesions Neurologic: Grossly normal  Results for orders placed or performed during the hospital encounter of 10/09/14 (from the past 48 hour(s))  Basic metabolic panel     Status: Abnormal   Collection Time: 10/09/14  8:03 AM  Result Value Ref Range   Sodium 142 135 - 145 mmol/L   Potassium 3.6 3.5 - 5.1 mmol/L   Chloride 105 101 - 111 mmol/L   CO2 24 22 - 32 mmol/L   Glucose, Bld 165 (H) 65 - 99 mg/dL   BUN 31 (H) 6 - 20 mg/dL   Creatinine, Ser 1.04 (H) 0.44 - 1.00 mg/dL   Calcium 9.9 8.9 - 10.3 mg/dL   GFR calc non Af Amer 48 (L) >60 mL/min   GFR calc Af Amer 56 (L) >60 mL/min    Comment: (NOTE) The eGFR has been calculated using the CKD EPI equation. This calculation has not been validated in all clinical situations.  eGFR's persistently <60 mL/min signify possible Chronic Kidney Disease.    Anion gap 13 5 - 15  Brain natriuretic peptide     Status: Abnormal   Collection Time: 10/09/14  8:03 AM  Result Value Ref Range   B Natriuretic Peptide 332.9 (H) 0.0 - 100.0 pg/mL  Troponin I     Status: Abnormal   Collection Time: 10/09/14  8:03 AM  Result Value Ref Range   Troponin I 0.04 (H) <0.031 ng/mL    Comment:        PERSISTENTLY INCREASED TROPONIN VALUES IN THE RANGE OF 0.04-0.49 ng/mL CAN BE SEEN IN:       -UNSTABLE ANGINA       -CONGESTIVE HEART FAILURE       -MYOCARDITIS       -CHEST  TRAUMA       -ARRYHTHMIAS       -LATE PRESENTING MYOCARDIAL INFARCTION       -COPD   CLINICAL FOLLOW-UP RECOMMENDED.   CBC with Differential     Status: None   Collection Time: 10/09/14  8:03 AM  Result Value Ref Range   WBC 9.1 4.0 - 10.5 K/uL   RBC 4.13 3.87 - 5.11 MIL/uL   Hemoglobin 12.3 12.0 - 15.0 g/dL   HCT 39.0 36.0 - 46.0 %   MCV 94.4 78.0 - 100.0 fL   MCH 29.8 26.0 - 34.0 pg   MCHC 31.5 30.0 - 36.0 g/dL   RDW 12.5 11.5 - 15.5 %   Platelets 344 150 - 400 K/uL   Neutrophils Relative % 65 43 - 77 %   Neutro Abs 5.9 1.7 - 7.7 K/uL   Lymphocytes Relative 29 12 - 46 %   Lymphs Abs 2.7 0.7 - 4.0 K/uL   Monocytes Relative 5 3 - 12 %   Monocytes Absolute 0.5 0.1 - 1.0 K/uL   Eosinophils Relative 1 0 - 5 %   Eosinophils Absolute 0.1 0.0 - 0.7 K/uL   Basophils Relative 0 0 - 1 %   Basophils Absolute 0.0 0.0 - 0.1 K/uL  Protime-INR     Status: None   Collection Time: 10/09/14  8:03 AM  Result Value Ref Range   Prothrombin Time 13.1 11.6 - 15.2 seconds   INR 0.97 0.00 - 1.49  TSH     Status: None   Collection Time: 10/09/14  8:03 AM  Result Value Ref Range   TSH 1.191 0.350 - 4.500 uIU/mL  Urinalysis, Routine w reflex microscopic (not at [facility])     Status: Abnormal   Collection Time: 10/09/14 11:11 AM  Result Value Ref Range   Color, Urine YELLOW YELLOW   APPearance CLOUDY (A) CLEAR   Specific Gravity, Urine 1.023 1.005 - 1.030   pH 5.0 5.0 - 8.0   Glucose, UA NEGATIVE NEGATIVE mg/dL   Hgb urine dipstick NEGATIVE NEGATIVE   Bilirubin Urine NEGATIVE NEGATIVE   Ketones, ur NEGATIVE NEGATIVE mg/dL   Protein, ur NEGATIVE NEGATIVE mg/dL   Urobilinogen, UA 0.2 0.0 - 1.0 mg/dL   Nitrite NEGATIVE NEGATIVE   Leukocytes, UA NEGATIVE NEGATIVE    Comment: MICROSCOPIC NOT DONE ON URINES WITH NEGATIVE PROTEIN, BLOOD, LEUKOCYTES, NITRITE, OR GLUCOSE <1000 mg/dL.  Troponin I     Status: None   Collection Time: 10/09/14 11:22 AM  Result Value Ref Range   Troponin I 0.03 <0.031  ng/mL    Comment:        NO INDICATION OF MYOCARDIAL INJURY.   Troponin I  Status: Abnormal   Collection Time: 10/09/14  3:41 PM  Result Value Ref Range   Troponin I 0.04 (H) <0.031 ng/mL    Comment:        PERSISTENTLY INCREASED TROPONIN VALUES IN THE RANGE OF 0.04-0.49 ng/mL CAN BE SEEN IN:       -UNSTABLE ANGINA       -CONGESTIVE HEART FAILURE       -MYOCARDITIS       -CHEST TRAUMA       -ARRYHTHMIAS       -LATE PRESENTING MYOCARDIAL INFARCTION       -COPD   CLINICAL FOLLOW-UP RECOMMENDED.   Troponin I     Status: Abnormal   Collection Time: 10/09/14  8:04 PM  Result Value Ref Range   Troponin I 0.04 (H) <0.031 ng/mL    Comment:        PERSISTENTLY INCREASED TROPONIN VALUES IN THE RANGE OF 0.04-0.49 ng/mL CAN BE SEEN IN:       -UNSTABLE ANGINA       -CONGESTIVE HEART FAILURE       -MYOCARDITIS       -CHEST TRAUMA       -ARRYHTHMIAS       -LATE PRESENTING MYOCARDIAL INFARCTION       -COPD   CLINICAL FOLLOW-UP RECOMMENDED.    Dg Chest Port 1 View  10/09/2014   CLINICAL DATA:  Atrial fibrillation with cardiac palpitations  EXAM: PORTABLE CHEST - 1 VIEW  COMPARISON:  November 29, 2012  FINDINGS: There is mild scarring in the left base. There is no edema or consolidation. The heart is mildly enlarged with pulmonary vascularity within normal limits. No adenopathy. There is atherosclerotic change in the aorta. There is calcification in both carotid arteries.  IMPRESSION: Scarring left base. No edema or consolidation. Stable cardiac prominence. Calcification noted in each carotid artery.   Electronically Signed   By: Lowella Grip III M.D.   On: 10/09/2014 08:20    Labs:   Lab Results  Component Value Date   WBC 9.1 10/09/2014   HGB 12.3 10/09/2014   HCT 39.0 10/09/2014   MCV 94.4 10/09/2014   PLT 344 10/09/2014    Recent Labs Lab 10/09/14 0803  NA 142  K 3.6  CL 105  CO2 24  BUN 31*  CREATININE 1.04*  CALCIUM 9.9  GLUCOSE 165*    Lipid Panel  No  results found for: CHOL, TRIG, HDL, CHOLHDL, VLDL, LDLCALC  BNP (last 3 results)  Recent Labs  10/09/14 0803  BNP 332.9*    Recent Labs  10/09/14 1122 10/09/14 1541 10/09/14 2004  TROPONINI 0.03 0.04* 0.04*    Lab Results  Component Value Date   TROPONINI 0.04* 10/09/2014     TSH  Recent Labs  10/09/14 0803  TSH 1.191    EKG 10/09/2014: 8:35 AM Atrial fibrillation with controlled response at the rate of 86 bpm, inferior and lateral ST segment depression cannot rule out ischemia.  At 7:44 AM, patient had atrial fibrillation with rapid ventricular response.  ST-T wave changes were again evident.  EKG at 2104 hrs. revealed normal sinus rhythm with frequent PACs.  Nonspecific T-wave flattening.  Medications Prior to Admission  Medication Sig Dispense Refill  . acetaminophen (TYLENOL) 500 MG tablet Take 500-1,000 mg by mouth every 6 (six) hours as needed for mild pain, moderate pain, fever or headache.     Marland Kitchen atorvastatin (LIPITOR) 10 MG tablet Take 10 mg by mouth daily.    Marland Kitchen  clorazepate (TRANXENE) 7.5 MG tablet Take 7.5 mg by mouth 4 (four) times daily.     Marland Kitchen lactose free nutrition (BOOST) LIQD Take 237 mLs by mouth daily.    Marland Kitchen lisinopril-hydrochlorothiazide (PRINZIDE,ZESTORETIC) 20-25 MG per tablet Take 1 tablet by mouth daily.    . metoprolol succinate (TOPROL-XL) 100 MG 24 hr tablet Take 100 mg by mouth daily. Take with or immediately following a meal.        Current facility-administered medications:  .  atorvastatin (LIPITOR) tablet 10 mg, 10 mg, Oral, Daily, Adrian Prows, MD, 10 mg at 10/09/14 1501 .  clorazepate (TRANXENE) tablet 7.5 mg, 7.5 mg, Oral, QID, Adrian Prows, MD, 7.5 mg at 10/09/14 2100 .  [START ON 10/10/2014] diltiazem (CARDIZEM CD) 24 hr capsule 180 mg, 180 mg, Oral, Daily, Adrian Prows, MD .  metoprolol succinate (TOPROL-XL) 24 hr tablet 100 mg, 100 mg, Oral, Daily, Adrian Prows, MD, 100 mg at 10/09/14 1501 .  Rivaroxaban (XARELTO) tablet 15 mg, 15 mg, Oral, Q  supper, Adrian Prows, MD, 15 mg at 10/09/14 2030  Assessment/Plan 1.  New onset atrial fibrillation with rapid ventricular response, spontaneously converted to sinus rhythm this evening. CHA2DS2-VASCScore: Risk Score  4,  Yearly risk of stroke  4. Recommendation: ASA No/Anticoagulation Yes  [place] Has Bled: Score 2.  Estimated risk of major bleeding at 1 year with OAC 1.8-3.2   2.  Abnormal EKG during atrial fibrillation, ST-T changes reverted back to normal.  Patient with no chest pain or shortness of breath or CHF on exam, abnormal serum troponin probably demand ischemia. 3.  Hypertension 4.  Hyperlipidemia 5.  Bilateral lung base crackles, chest x-ray revealing scarring. 6.  Mildly elevated BNP secondary to acute diastolic heart failure from A. Fib with RVR. 7.  Hyperglycemia 8.  Chronic renal failure, stage III, eGFR 48 mL.  Recommendation: I had a long discussion with the patient and her son at the bedside regarding atrial fibrillation, and the risk of bleeding.  After long discussion, discussing regarding Coumadin versus newer agents I will start the patient on Xarelto 15 mg by mouth every afternoon.  Due to significant GERD and difficulty in swallowing Pradaxa may be difficult to administer.  Patient is converted to sinus rhythm.  I will watch her here tonight and if she remains stable, I will discharge her home with outpatient echocardiogram and possibly need for a stress test due to abnormal EKG.  Although 81 years of age, patient is fairly active and lives independently. I will check HbA1c.  Adrian Prows, MD 10/09/2014, 9:18 PM [facility]. [place] Pager: [phone] Office: [phone] If no answer: Cell:  [phone]

## 2014-10-10 DIAGNOSIS — I4891 Unspecified atrial fibrillation: Secondary | ICD-10-CM | POA: Diagnosis not present

## 2014-10-10 DIAGNOSIS — E785 Hyperlipidemia, unspecified: Secondary | ICD-10-CM | POA: Diagnosis not present

## 2014-10-10 DIAGNOSIS — R739 Hyperglycemia, unspecified: Secondary | ICD-10-CM | POA: Diagnosis not present

## 2014-10-10 DIAGNOSIS — I5031 Acute diastolic (congestive) heart failure: Secondary | ICD-10-CM | POA: Diagnosis not present

## 2014-10-10 LAB — CBC
HCT: 41 % (ref 36.0–46.0)
HEMOGLOBIN: 13.3 g/dL (ref 12.0–15.0)
MCH: 30.6 pg (ref 26.0–34.0)
MCHC: 32.4 g/dL (ref 30.0–36.0)
MCV: 94.3 fL (ref 78.0–100.0)
Platelets: 369 10*3/uL (ref 150–400)
RBC: 4.35 MIL/uL (ref 3.87–5.11)
RDW: 12.4 % (ref 11.5–15.5)
WBC: 16.5 10*3/uL — ABNORMAL HIGH (ref 4.0–10.5)

## 2014-10-10 LAB — BASIC METABOLIC PANEL
ANION GAP: 12 (ref 5–15)
BUN: 20 mg/dL (ref 6–20)
CHLORIDE: 102 mmol/L (ref 101–111)
CO2: 23 mmol/L (ref 22–32)
Calcium: 9.6 mg/dL (ref 8.9–10.3)
Creatinine, Ser: 1 mg/dL (ref 0.44–1.00)
GFR calc Af Amer: 58 mL/min — ABNORMAL LOW (ref >60)
GFR calc non Af Amer: 50 mL/min — ABNORMAL LOW (ref >60)
GLUCOSE: 195 mg/dL — AB (ref 65–99)
POTASSIUM: 4.2 mmol/L (ref 3.5–5.1)
Sodium: 137 mmol/L (ref 135–145)

## 2014-10-10 LAB — TROPONIN I: Troponin I: 0.04 ng/mL — ABNORMAL HIGH (ref <0.031)

## 2014-10-10 MED ORDER — FUROSEMIDE 10 MG/ML IJ SOLN
40.0000 mg | Freq: Once | INTRAMUSCULAR | Status: AC
  Administered 2014-10-10: 40 mg via INTRAVENOUS

## 2014-10-10 MED ORDER — DILTIAZEM HCL ER COATED BEADS 180 MG PO CP24: 180.0000 mg | ORAL_CAPSULE | Freq: Every day | ORAL | Status: DC

## 2014-10-10 MED ORDER — RIVAROXABAN 15 MG PO TABS: 15.0000 mg | ORAL_TABLET | Freq: Every day | ORAL | Status: AC

## 2014-10-10 MED ORDER — LEVALBUTEROL HCL 0.63 MG/3ML IN NEBU
INHALATION_SOLUTION | RESPIRATORY_TRACT | Status: AC
  Filled 2014-10-10: qty 3

## 2014-10-10 MED ORDER — FUROSEMIDE 10 MG/ML IJ SOLN
INTRAMUSCULAR | Status: AC
  Filled 2014-10-10: qty 4

## 2014-10-10 MED ORDER — LEVALBUTEROL HCL 1.25 MG/0.5ML IN NEBU
1.2500 mg | INHALATION_SOLUTION | Freq: Once | RESPIRATORY_TRACT | Status: AC
  Administered 2014-10-10: 1.25 mg via RESPIRATORY_TRACT
  Filled 2014-10-10: qty 0.5

## 2014-10-10 MED ORDER — OFF THE BEAT BOOK
Freq: Once | Status: AC
  Administered 2014-10-10: 10:00:00
  Filled 2014-10-10: qty 1

## 2014-10-10 NOTE — Care Management Note (Signed)
Case Management Note  Patient Details  Name: Penny Ramos MRN: [id number] Date of Birth: [birth year]  Subjective/Objective:      Pt admitted with chest pain       Action/Plan:  Pt is independent from home, son will provide support.  Pt has private duty care in the home during mornings for 5 days a week.  Pt will discharge home on Xarelto, CM will assist with initiation post discharge.   Expected Discharge Date:                  Expected Discharge Plan:  Home/Self Care  In-House Referral:     Discharge planning Services  CM Consult, Medication Assistance  Post Acute Care Choice:    Choice offered to:     DME Arranged:    DME Agency:     HH Arranged:    HH Agency:     Status of Service:  Completed, signed off  Medicare Important Message Given:    Date Medicare IM Given:    Medicare IM give by:    Date Additional Medicare IM Given:    Additional Medicare Important Message give by:     If discussed at Long Length of Stay Meetings, dates discussed:    Additional Comments: CM submitted benefit check.  CM provided pt with 30 day free trial card for Xarelto.  Pt stated her preferred pharmacy was [facility] on [place], [place] contacted pharmacy and was informed that pharmacy can fill script today.   Cherylann Parr, RN 10/10/2014, 2:06 PM

## 2014-10-10 NOTE — Progress Notes (Signed)
ON CALL NOTIFIED OF PATIENT'S HYPOXIA, SOB, HTN, AND CRACKLES.  LASIX AND NEB ORDERED.

## 2014-10-10 NOTE — Progress Notes (Signed)
Reviewed discharge instructions with patient and family. IVs removed with out issues. Awaiting discharge.   Zaelyn Noack, Charlaine Dalton RN

## 2014-10-10 NOTE — Discharge Summary (Signed)
Physician Discharge Summary  Patient ID: Penny Ramos MRN: [id number] DOB/AGE: 1927/11/28 81 y.o.  Admit date: 10/09/2014 Discharge date: 10/10/2014  Primary Discharge Diagnosis 1. New onset atrial fibrillation with rapid ventricular response, patient spontaneously converted to sinus rhythm. CHA2DS2-VASCScore: Risk Score 4, Yearly risk of stroke 4. Recommendation: ASA No/Anticoagulation Yes  Tyrone Has Bled: Score 2. Estimated risk of major bleeding at 1 year with OAC 1.8-3.2  2. Abnormal EKG during atrial fibrillation, ST-T changes reverted back to normal. Patient with no chest pain or shortness of breath or CHF on exam, abnormal serum troponin probably demand ischemia. 3. Hypertension 4. Hyperlipidemia 5. Bilateral lung base crackles, chest x-ray revealing scarring. 6. Mildly elevated BNP secondary to acute diastolic heart failure from A. Fib with RVR. 7. Hyperglycemia 8. Chronic renal failure, stage III, eGFR 48 mL to 50 mL. 9. Hyperglycemia, HbA1c pending at discharge.   Significant Diagnostic Studies: None  Hospital Course: Penny Ramos is a 81 y.o. female With history of hypertension, hyperlipidemia, who lives independently at home, woke up at 4 AM with palpitations. She didn't feel well, waited till 7 AM and called her son and eventually presented to the emergency room where she was found to be in atrial fibrillation with rapid ventricular response.  Otherwise she did well, had no shortness of breath, no dizziness or syncope, but they have hospital admission in the afternoon, patient spontaneously converted to sinus rhythm. She was continued on beta blocker along with calcium channel blocker, Cardizem that was started new along with metoprolol that patient was previously on. The evening of hospital admission, patient had one episode of shortness of breath, elevated blood pressure, she responded to IV Lasix. She had no recurrence of symptoms, she walked in the  hallway without any chest pain or shortness of breath, maintain sinus rhythm and hence felt stable for discharge with outpatient follow-up and management. I had a extensive discussion with patient and her son at the bedside regarding risks of anticoagulation and risk of cardioembolic phenomena with paroxysmal atrial fibrillation. They're willing to start Xarelto.  Discharge Exam: Blood pressure 144/75, pulse 84, temperature 97.3 F (36.3 C), temperature source Oral, resp. rate 18, height $RemoveBe'5\' 4"'aMiDZCZzA$  (1.626 m), weight 70.308 kg (155 lb), SpO2 93 %.  General appearance: alert, cooperative, appears stated age, no distress and mildly obese Eyes: negative findings: lids and lashes normal Neck: no adenopathy, no carotid bruit, no JVD, supple, symmetrical, trachea midline and thyroid not enlarged, symmetric, no tenderness/mass/nodules Neck: JVP - normal, carotids 2+= without bruits Resp: there is coarse crackles at both the lung bases. Chest wall: no tenderness Cardio: regular rate and rhythm, S1, S2 normal, no murmur, click, rub or gallop GI: soft, non-tender; bowel sounds normal; no masses, no organomegaly and Obese Extremities: extremities normal, atraumatic, no cyanosis or edema Pulses: 2+ and symmetric Skin: Skin color, texture, turgor normal. No rashes or lesions Neurologic: Grossly normal  Labs:   Lab Results  Component Value Date   WBC 16.5* 10/10/2014   HGB 13.3 10/10/2014   HCT 41.0 10/10/2014   MCV 94.3 10/10/2014   PLT 369 10/10/2014    Recent Labs Lab 10/10/14 0140  NA 137  K 4.2  CL 102  CO2 23  BUN 20  CREATININE 1.00  CALCIUM 9.6  GLUCOSE 195*    Lipid Panel  No results found for: CHOL, TRIG, HDL, CHOLHDL, VLDL, LDLCALC  BNP (last 3 results)  Recent Labs  10/09/14 0803  BNP 332.9*    Recent  Labs  10/09/14 1541 10/09/14 2004 10/10/14 0140  TROPONINI 0.04* 0.04* 0.04*    Lab Results  Component Value Date   TROPONINI 0.04* 10/10/2014     TSH  Recent  Labs  10/09/14 0803  TSH 1.191    EKG 10/09/2014: 8:35 AM Atrial fibrillation with controlled response at the rate of 86 bpm, inferior and lateral ST segment depression cannot rule out ischemia. At 7:44 AM, patient had atrial fibrillation with rapid ventricular response. ST-T wave changes were again evident.  EKG at 2104 hrs. revealed normal sinus rhythm with frequent PACs. Nonspecific T-wave flattening.  Tele 10/10/2014: NSR, PAC. Radiology: Dg Chest Port 1 View  10/09/2014   CLINICAL DATA:  Atrial fibrillation with cardiac palpitations  EXAM: PORTABLE CHEST - 1 VIEW  COMPARISON:  November 29, 2012  FINDINGS: There is mild scarring in the left base. There is no edema or consolidation. The heart is mildly enlarged with pulmonary vascularity within normal limits. No adenopathy. There is atherosclerotic change in the aorta. There is calcification in both carotid arteries.  IMPRESSION: Scarring left base. No edema or consolidation. Stable cardiac prominence. Calcification noted in each carotid artery.   Electronically Signed   By: Lowella Grip III M.D.   On: 10/09/2014 08:20   FOLLOW UP PLANS AND APPOINTMENTS    Medication List    TAKE these medications        acetaminophen 500 MG tablet  Commonly known as:  TYLENOL  Take 500-1,000 mg by mouth every 6 (six) hours as needed for mild pain, moderate pain, fever or headache.     atorvastatin 10 MG tablet  Commonly known as:  LIPITOR  Take 10 mg by mouth daily.     clorazepate 7.5 MG tablet  Commonly known as:  TRANXENE  Take 7.5 mg by mouth 4 (four) times daily.     diltiazem 180 MG 24 hr capsule  Commonly known as:  CARDIZEM CD  Take 1 capsule (180 mg total) by mouth daily.     lactose free nutrition Liqd  Take 237 mLs by mouth daily.     lisinopril-hydrochlorothiazide 20-25 MG per tablet  Commonly known as:  PRINZIDE,ZESTORETIC  Take 1 tablet by mouth daily.     metoprolol succinate 100 MG 24 hr tablet  Commonly known as:   TOPROL-XL  Take 100 mg by mouth daily. Take with or immediately following a meal.     Rivaroxaban 15 MG Tabs tablet  Commonly known as:  XARELTO  Take 1 tablet (15 mg total) by mouth daily with supper.           Follow-up Information    Follow up with Adrian Prows, MD On 10/13/2014.   Specialty:  Cardiology   Why:  Come in at 830am for stress test. 10/17/2014 at 11:45 am for echo and OV with me on 11/03/2014 at [facility] information:   [address] [place] [address] [phone]        Adrian Prows, MD 10/10/2014, 12:46 PM  Pager: [phone] Office: [phone] If no answer: [phone]

## 2014-10-10 NOTE — Discharge Instructions (Addendum)
Atrial Fibrillation °Atrial fibrillation is a type of irregular heart rhythm (arrhythmia). During atrial fibrillation, the upper chambers of the heart (atria) quiver continuously in a chaotic pattern. This causes an irregular and often rapid heart rate.  °Atrial fibrillation is the result of the heart becoming overloaded with disorganized signals that tell it to beat. These signals are normally released one at a time by a part of the right atrium called the sinoatrial node. They then travel from the atria to the lower chambers of the heart (ventricles), causing the atria and ventricles to contract and pump blood as they pass. In atrial fibrillation, parts of the atria outside of the sinoatrial node also release these signals. This results in two problems. First, the atria receive so many signals that they do not have time to fully contract. Second, the ventricles, which can only receive one signal at a time, beat irregularly and out of rhythm with the atria.  °There are three types of atrial fibrillation:  °· Paroxysmal. Paroxysmal atrial fibrillation starts suddenly and stops on its own within a week. °· Persistent. Persistent atrial fibrillation lasts for more than a week. It may stop on its own or with treatment. °· Permanent. Permanent atrial fibrillation does not go away. Episodes of atrial fibrillation may lead to permanent atrial fibrillation. °Atrial fibrillation can prevent your heart from pumping blood normally. It increases your risk of stroke and can lead to heart failure.  °CAUSES  °· Heart conditions, including a heart attack, heart failure, coronary artery disease, and heart valve conditions.   °· Inflammation of the sac that surrounds the heart (pericarditis). °· Blockage of an artery in the lungs (pulmonary embolism). °· Pneumonia or other infections. °· Chronic lung disease. °· Thyroid problems, especially if the thyroid is overactive (hyperthyroidism). °· Caffeine, excessive alcohol use, and use  of some illegal drugs.   °· Use of some medicines, including certain decongestants and diet pills. °· Heart surgery.   °· Birth defects.   °Sometimes, no cause can be found. When this happens, the atrial fibrillation is called lone atrial fibrillation. The risk of complications from atrial fibrillation increases if you have lone atrial fibrillation and you are age 60 years or older. °RISK FACTORS °· Heart failure. °· Coronary artery disease. °· Diabetes mellitus.   °· High blood pressure (hypertension).   °· Obesity.   °· Other arrhythmias.   °· Increased age. °SIGNS AND SYMPTOMS  °· A feeling that your heart is beating rapidly or irregularly.   °· A feeling of discomfort or pain in your chest.   °· Shortness of breath.   °· Sudden light-headedness or weakness.   °· Getting tired easily when exercising.   °· Urinating more often than normal (mainly when atrial fibrillation first begins).   °In paroxysmal atrial fibrillation, symptoms may start and suddenly stop. °DIAGNOSIS  °Your health care provider may be able to detect atrial fibrillation when taking your pulse. Your health care provider may have you take a test called an ambulatory electrocardiogram (ECG). An ECG records your heartbeat patterns over a 24-hour period. You may also have other tests, such as: °· Transthoracic echocardiogram (TTE). During echocardiography, sound waves are used to evaluate how blood flows through your heart. °· Transesophageal echocardiogram (TEE). °· Stress test. There is more than one type of stress test. If a stress test is needed, ask your health care provider about which type is best for you. °· Chest X-ray exam. °· Blood tests. °· Computed tomography (CT). °TREATMENT  °Treatment may include: °· Treating any underlying conditions. For example, if you   have an overactive thyroid, treating the condition may correct atrial fibrillation. °· Taking medicine. Medicines may be given to control a rapid heart rate or to prevent blood  clots, heart failure, or a stroke. °· Having a procedure to correct the rhythm of the heart: °¨ Electrical cardioversion. During electrical cardioversion, a controlled, low-energy shock is delivered to the heart through your skin. If you have chest pain, very low blood pressure, or sudden heart failure, this procedure may need to be done as an emergency. °¨ Catheter ablation. During this procedure, heart tissues that send the signals that cause atrial fibrillation are destroyed. °¨ Surgical ablation. During this surgery, thin lines of heart tissue that carry the abnormal signals are destroyed. This procedure can either be an open-heart surgery or a minimally invasive surgery. With the minimally invasive surgery, small cuts are made to access the heart instead of a large opening. °¨ Pulmonary venous isolation. During this surgery, tissue around the veins that carry blood from the lungs (pulmonary veins) is destroyed. This tissue is thought to carry the abnormal signals. °HOME CARE INSTRUCTIONS  °· Take medicines only as directed by your health care provider. Some medicines can make atrial fibrillation worse or recur. °· If blood thinners were prescribed by your health care provider, take them exactly as directed. Too much blood-thinning medicine can cause bleeding. If you take too little, you will not have the needed protection against stroke and other problems. °· Perform blood tests at home if directed by your health care provider. Perform blood tests exactly as directed. °· Quit smoking if you smoke. °· Do not drink alcohol. °· Do not drink caffeinated beverages such as coffee, soda, and some teas. You may drink decaffeinated coffee, soda, or tea.   °· Maintain a healthy weight. Do not use diet pills unless your health care provider approves. They may make heart problems worse.   °· Follow diet instructions as directed by your health care provider. °· Exercise regularly as directed by your health care  provider. °· Keep all follow-up visits as directed by your health care provider. This is important. °PREVENTION  °The following substances can cause atrial fibrillation to recur:  °· Caffeinated beverages. °· Alcohol. °· Certain medicines, especially those used for breathing problems. °· Certain herbs and herbal medicines, such as those containing ephedra or ginseng. °· Illegal drugs, such as cocaine and amphetamines. °Sometimes medicines are given to prevent atrial fibrillation from recurring. Proper treatment of any underlying condition is also important in helping prevent recurrence.  °SEEK MEDICAL CARE IF: °· You notice a change in the rate, rhythm, or strength of your heartbeat. °· You suddenly begin urinating more frequently. °· You tire more easily when exerting yourself or exercising. °SEEK IMMEDIATE MEDICAL CARE IF:  °· You have chest pain, abdominal pain, sweating, or weakness. °· You feel nauseous. °· You have shortness of breath. °· You suddenly have swollen feet and ankles. °· You feel dizzy. °· Your face or limbs feel numb or weak. °· You have a change in your vision or speech. °MAKE SURE YOU:  °· Understand these instructions. °· Will watch your condition. °· Will get help right away if you are not doing well or get worse. °Document Released: 02/10/2005 Document Revised: 06/27/2013 Document Reviewed: 03/23/2012 °[employer]® Patient Information ©2015 [employer]. This information is not intended to replace advice given to you by your health care provider. Make sure you discuss any questions you have with your health care provider. ° °Atrial Fibrillation °Atrial fibrillation is   a type of irregular heart rhythm (arrhythmia). During atrial fibrillation, the upper chambers of the heart (atria) quiver continuously in a chaotic pattern. This causes an irregular and often rapid heart rate.  Atrial fibrillation is the result of the heart becoming overloaded with disorganized signals that tell it to beat.  These signals are normally released one at a time by a part of the right atrium called the sinoatrial node. They then travel from the atria to the lower chambers of the heart (ventricles), causing the atria and ventricles to contract and pump blood as they pass. In atrial fibrillation, parts of the atria outside of the sinoatrial node also release these signals. This results in two problems. First, the atria receive so many signals that they do not have time to fully contract. Second, the ventricles, which can only receive one signal at a time, beat irregularly and out of rhythm with the atria.  There are three types of atrial fibrillation:   Paroxysmal. Paroxysmal atrial fibrillation starts suddenly and stops on its own within a week.  Persistent. Persistent atrial fibrillation lasts for more than a week. It may stop on its own or with treatment.  Permanent. Permanent atrial fibrillation does not go away. Episodes of atrial fibrillation may lead to permanent atrial fibrillation. Atrial fibrillation can prevent your heart from pumping blood normally. It increases your risk of stroke and can lead to heart failure.  CAUSES   Heart conditions, including a heart attack, heart failure, coronary artery disease, and heart valve conditions.   Inflammation of the sac that surrounds the heart (pericarditis).  Blockage of an artery in the lungs (pulmonary embolism).  Pneumonia or other infections.  Chronic lung disease.  Thyroid problems, especially if the thyroid is overactive (hyperthyroidism).  Caffeine, excessive alcohol use, and use of some illegal drugs.   Use of some medicines, including certain decongestants and diet pills.  Heart surgery.   Birth defects.  Sometimes, no cause can be found. When this happens, the atrial fibrillation is called lone atrial fibrillation. The risk of complications from atrial fibrillation increases if you have lone atrial fibrillation and you are age 42  years or older. RISK FACTORS  Heart failure.  Coronary artery disease.  Diabetes mellitus.   High blood pressure (hypertension).   Obesity.   Other arrhythmias.   Increased age. SIGNS AND SYMPTOMS   A feeling that your heart is beating rapidly or irregularly.   A feeling of discomfort or pain in your chest.   Shortness of breath.   Sudden light-headedness or weakness.   Getting tired easily when exercising.   Urinating more often than normal (mainly when atrial fibrillation first begins).  In paroxysmal atrial fibrillation, symptoms may start and suddenly stop. DIAGNOSIS  Your health care provider may be able to detect atrial fibrillation when taking your pulse. Your health care provider may have you take a test called an ambulatory electrocardiogram (ECG). An ECG records your heartbeat patterns over a 24-hour period. You may also have other tests, such as:  Transthoracic echocardiogram (TTE). During echocardiography, sound waves are used to evaluate how blood flows through your heart.  Transesophageal echocardiogram (TEE).  Stress test. There is more than one type of stress test. If a stress test is needed, ask your health care provider about which type is best for you.  Chest X-ray exam.  Blood tests.  Computed tomography (CT). TREATMENT  Treatment may include:  Treating any underlying conditions. For example, if you have an overactive thyroid,  treating the condition may correct atrial fibrillation.  Taking medicine. Medicines may be given to control a rapid heart rate or to prevent blood clots, heart failure, or a stroke.  Having a procedure to correct the rhythm of the heart:  Electrical cardioversion. During electrical cardioversion, a controlled, low-energy shock is delivered to the heart through your skin. If you have chest pain, very low blood pressure, or sudden heart failure, this procedure may need to be done as an emergency.  Catheter  ablation. During this procedure, heart tissues that send the signals that cause atrial fibrillation are destroyed.  Surgical ablation. During this surgery, thin lines of heart tissue that carry the abnormal signals are destroyed. This procedure can either be an open-heart surgery or a minimally invasive surgery. With the minimally invasive surgery, small cuts are made to access the heart instead of a large opening.  Pulmonary venous isolation. During this surgery, tissue around the veins that carry blood from the lungs (pulmonary veins) is destroyed. This tissue is thought to carry the abnormal signals. HOME CARE INSTRUCTIONS   Take medicines only as directed by your health care provider. Some medicines can make atrial fibrillation worse or recur.  If blood thinners were prescribed by your health care provider, take them exactly as directed. Too much blood-thinning medicine can cause bleeding. If you take too little, you will not have the needed protection against stroke and other problems.  Perform blood tests at home if directed by your health care provider. Perform blood tests exactly as directed.  Quit smoking if you smoke.  Do not drink alcohol.  Do not drink caffeinated beverages such as coffee, soda, and some teas. You may drink decaffeinated coffee, soda, or tea.   Maintain a healthy weight.Do not use diet pills unless your health care provider approves. They may make heart problems worse.   Follow diet instructions as directed by your health care provider.  Exercise regularly as directed by your health care provider.  Keep all follow-up visits as directed by your health care provider. This is important. PREVENTION  The following substances can cause atrial fibrillation to recur:   Caffeinated beverages.  Alcohol.  Certain medicines, especially those used for breathing problems.  Certain herbs and herbal medicines, such as those containing ephedra or ginseng.  Illegal  drugs, such as cocaine and amphetamines. Sometimes medicines are given to prevent atrial fibrillation from recurring. Proper treatment of any underlying condition is also important in helping prevent recurrence.  SEEK MEDICAL CARE IF:  You notice a change in the rate, rhythm, or strength of your heartbeat.  You suddenly begin urinating more frequently.  You tire more easily when exerting yourself or exercising. SEEK IMMEDIATE MEDICAL CARE IF:   You have chest pain, abdominal pain, sweating, or weakness.  You feel nauseous.  You have shortness of breath.  You suddenly have swollen feet and ankles.  You feel dizzy.  Your face or limbs feel numb or weak.  You have a change in your vision or speech. MAKE SURE YOU:   Understand these instructions.  Will watch your condition.  Will get help right away if you are not doing well or get worse. Document Released: 02/10/2005 Document Revised: 06/27/2013 Document Reviewed: 03/23/2012 [facility] Patient Information 2015 [place], [place]. This information is not intended to replace advice given to you by your health care provider. Make sure you discuss any questions you have with your health care provider.  Anticoagulation, Generic Anticoagulants are medicines used to prevent clots  from developing in your veins. These medicine are also known as blood thinners. If blood clots are untreated, they could travel to your lungs. This is called a pulmonary embolus. A blood clot in your lungs can be fatal.  Health care providers often use anticoagulants to prevent clots following surgery. Anticoagulants are also used along with aspirin when the heart is not getting enough blood. Another anticoagulant called warfarin is started 2 to 3 days after a rapid-acting injectable anticoagulant is started. The rapid-acting anticoagulants are usually continued until warfarin has begun to work. Your health care provider will judge this length of time by blood tests  known as the prothrombin time (PT) and International Normalization Ratio (INR). This means that your blood is at the necessary and best level to prevent clots. RISKS AND COMPLICATIONS  If you have received recent epidural anesthesia, spinal anesthesia, or a spinal tap while receiving anticoagulants, you are at risk for developing a blood clot in or around the spine. This condition could result in long-term or permanent paralysis.  Because anticoagulants thin your blood, severe bleeding may occur from any tissue or organ. Symptoms of the blood being too thin may include:  Bleeding from the nose or gums that does not stop quickly.  Blood in bowel movements which may appear as bright red, dark, or black tarry stools.  Blood in the urine which may appear as pink, red, or brown urine.  Unusual bruising or bruising easily.  A cut that does not stop bleeding within 10 minutes.  Vomiting blood or continuous nausea for more than 1 day.  Coughing up blood.  Broken blood vessels in your eye (subconjunctival hemorrhage).  Abdominal or back pain with or without flank bruising.  Sudden, severe headache.  Sudden weakness or numbness of the face, arm, or leg, especially on one side of the body.  Sudden confusion.  Trouble speaking (aphasia) or understanding.  Sudden trouble seeing in one or both eyes.  Sudden trouble walking.  Dizziness.  Loss of balance or coordination.  Vaginal bleeding.  Swelling or pain at an injection site.  Superficial fat tissue death (necrosis) which may cause skin scarring. This is more common in women and may first present as pain in the waist, thighs, or buttocks.  Fever.  Too little anticoagulation continues to allow the risk for blood clots. HOME CARE INSTRUCTIONS   Due to the complications of anticoagulants, it is very important that you take your anticoagulant as directed by your health care provider. Anticoagulants need to be taken exactly as  instructed. Be sure you understand all your anticoagulant instructions.  Keep all follow-up appointments with your health care provider as directed. It is very important to keep your appointments. Not keeping appointments could result in a chronic or permanent injury, pain, or disability.  Warfarin. Your health care provider will advise you on the length of treatment (usually 3-6 months, sometimes lifelong).  Take warfarin exactly as directed by your health care provider. It is recommended that you take your warfarin dose at the same time of the day. It is preferred that you take warfarin in the late afternoon. If you have been told to stop taking warfarin, do not resume taking warfarin until directed to do so by your health care provider. Follow your health care provider's instructions if you accidentally take an extra dose or miss a dose of warfarin. It is very important to take warfarin as directed since bleeding or blood clots could result in chronic or permanent injury,  pain, or disability.  Too much and too little warfarin are both dangerous. Too much warfarin increases the risk of bleeding. Too little warfarin continues to allow the risk for blood clots. While taking warfarin, you will need to have regular blood tests to measure your blood clotting time. These blood tests usually include both the prothrombin time (PT) and International Normalized Ratio (INR) tests. The PT and INR results allow your health care provider to adjust your dose of warfarin. The dose can change for many reasons. It is critically important that you have your PT and INR levels drawn exactly as directed. Your warfarin dose may stay the same or change depending on what the PT and INR results are. Be sure to follow up with your health care provider regarding your PT and INR test results and what your warfarin dosage should be.  Many medicines can interfere with warfarin and affect the PT and INR results. You must tell your  health care provider about any and all medicines you take, this includes all vitamins and supplements. Ask your health care provider before taking these. Prescription and over-the-counter medicine consistency is critical to warfarin management. It is important that potential interactions are checked before you start a new medicine. Be especially cautious with aspirin and anti-inflammatory medicines. Ask your health care provider before taking these. Medicines such as antibiotics and acid-reducing medicine can interact with warfarin and can cause an increased warfarin effect. Warfarin can also interfere with the effectiveness of medicines you are taking. Do not take or discontinue any prescribed or over-the-counter medicine except on the advice of your health care provider or pharmacist.  Some vitamins, supplements, and herbal products interfere with the effectiveness of warfarin. Vitamin E may increase the anticoagulant effects of warfarin. Vitamin K may can cause warfarin to be less effective. Do not take or discontinue any vitamin, supplement, or herbal product except on the advice of your health care provider or pharmacist.  Eat what you normally eat and keep the vitamin K content of your diet consistent. Avoid major changes in your diet, or notify your health care provider before changing your diet. Suddenly getting a lot more vitamin K could cause your blood to clot too quickly. A sudden decrease in vitamin K intake could cause your blood to clot too slowly. These changes in vitamin K intake could lead to dangerous blood clotsor to bleeding. To keep your vitamin K intake consistent, you must be aware of which foods contain moderate or high amounts of vitamin K. Some foods high in vitamin K include spinach, kale, broccoli, cabbage, greens, Brussels sprouts, asparagus, [place], coleslaw, parsley, and green tea. Arrange a visit with a dietitian to answer your questions.  If you have a loss of appetite or  get the stomach flu (viral gastroenteritis), talk to your health care provider as soon as possible. A decrease in your normal vitamin K intake can make you more sensitive to your usual dose of warfarin.  Some medical conditions may increase your risk for bleeding while you are taking warfarin. A fever, diarrhea lasting more than a day, worsening heart failure, or worsening liver function are some medical conditions that could affect warfarin. Contact your health care provider if you have any of these medical conditions.  Alcohol can change the body's ability to handle warfarin. It is best to avoid alcoholic drinks or consume only very small amounts while taking warfarin. Notify your health care provider if you change your alcohol intake. A sudden increase  in alcohol use can increase your risk of bleeding. Chronic alcohol use can cause warfarin to be less effective.  Be careful not to cut yourself when using sharp objects or while shaving.  Inform all your health care providers and your dentist that you take an anticoagulant.  Limit physical activities or sports that could result in a fall or cause injury. Avoid contact sports.  Wear medical alert jewelry or carry a medical alert card. SEEK IMMEDIATE MEDICAL CARE IF:  You cough up blood.  You have dark or black stools or there is bright red blood coming from your rectum.  You vomit blood or have nausea for more than 1 day.  You have blood in the urine or pink colored urine.  You have unusual bruising or have increased bruising.  You have bleeding from the nose or gums that does not stop quickly.  You have a cut that does not stop bleeding within a 2-3 minutes.  You have sudden weakness or numbness of the face, arm, or leg, especially on one side of the body.  You have sudden confusion.  You have trouble speaking (aphasia) or understanding.  You have sudden trouble seeing in one or both eyes.  You have sudden trouble  walking.  You have dizziness.  You have a loss of balance or coordination.  You have a sudden, severe headache.  You have a serious fall or head injury, even if you are not bleeding.  You have swelling or pain at an injection site.  You have unexplained tenderness or pain in the abdomen, back, waist, thighs or buttocks.  You have a fever. Any of these symptoms may represent a serious problem that is an emergency. Do not wait to see if the symptoms will go away. Get medical help right away. Call your local emergency services (911 in [place] Do not drive yourself to the hospital. Document Released: 02/10/2005 Document Revised: 02/15/2013 Document Reviewed: 09/15/2007 [facility] Patient Information 2015 [place], [place]. This information is not intended to replace advice given to you by your health care provider. Make sure you discuss any questions you have with your health care provider.  Atrial Fibrillation Atrial fibrillation is a type of irregular heart rhythm (arrhythmia). During atrial fibrillation, the upper chambers of the heart (atria) quiver continuously in a chaotic pattern. This causes an irregular and often rapid heart rate.  Atrial fibrillation is the result of the heart becoming overloaded with disorganized signals that tell it to beat. These signals are normally released one at a time by a part of the right atrium called the sinoatrial node. They then travel from the atria to the lower chambers of the heart (ventricles), causing the atria and ventricles to contract and pump blood as they pass. In atrial fibrillation, parts of the atria outside of the sinoatrial node also release these signals. This results in two problems. First, the atria receive so many signals that they do not have time to fully contract. Second, the ventricles, which can only receive one signal at a time, beat irregularly and out of rhythm with the atria.  There are three types of atrial fibrillation:    Paroxysmal. Paroxysmal atrial fibrillation starts suddenly and stops on its own within a week.  Persistent. Persistent atrial fibrillation lasts for more than a week. It may stop on its own or with treatment.  Permanent. Permanent atrial fibrillation does not go away. Episodes of atrial fibrillation may lead to permanent atrial fibrillation. Atrial fibrillation can prevent your heart  from pumping blood normally. It increases your risk of stroke and can lead to heart failure.  CAUSES   Heart conditions, including a heart attack, heart failure, coronary artery disease, and heart valve conditions.   Inflammation of the sac that surrounds the heart (pericarditis).  Blockage of an artery in the lungs (pulmonary embolism).  Pneumonia or other infections.  Chronic lung disease.  Thyroid problems, especially if the thyroid is overactive (hyperthyroidism).  Caffeine, excessive alcohol use, and use of some illegal drugs.   Use of some medicines, including certain decongestants and diet pills.  Heart surgery.   Birth defects.  Sometimes, no cause can be found. When this happens, the atrial fibrillation is called lone atrial fibrillation. The risk of complications from atrial fibrillation increases if you have lone atrial fibrillation and you are age 11 years or older. RISK FACTORS  Heart failure.  Coronary artery disease.  Diabetes mellitus.   High blood pressure (hypertension).   Obesity.   Other arrhythmias.   Increased age. SIGNS AND SYMPTOMS   A feeling that your heart is beating rapidly or irregularly.   A feeling of discomfort or pain in your chest.   Shortness of breath.   Sudden light-headedness or weakness.   Getting tired easily when exercising.   Urinating more often than normal (mainly when atrial fibrillation first begins).  In paroxysmal atrial fibrillation, symptoms may start and suddenly stop. DIAGNOSIS  Your health care provider may be  able to detect atrial fibrillation when taking your pulse. Your health care provider may have you take a test called an ambulatory electrocardiogram (ECG). An ECG records your heartbeat patterns over a 24-hour period. You may also have other tests, such as:  Transthoracic echocardiogram (TTE). During echocardiography, sound waves are used to evaluate how blood flows through your heart.  Transesophageal echocardiogram (TEE).  Stress test. There is more than one type of stress test. If a stress test is needed, ask your health care provider about which type is best for you.  Chest X-ray exam.  Blood tests.  Computed tomography (CT). TREATMENT  Treatment may include:  Treating any underlying conditions. For example, if you have an overactive thyroid, treating the condition may correct atrial fibrillation.  Taking medicine. Medicines may be given to control a rapid heart rate or to prevent blood clots, heart failure, or a stroke.  Having a procedure to correct the rhythm of the heart:  Electrical cardioversion. During electrical cardioversion, a controlled, low-energy shock is delivered to the heart through your skin. If you have chest pain, very low blood pressure, or sudden heart failure, this procedure may need to be done as an emergency.  Catheter ablation. During this procedure, heart tissues that send the signals that cause atrial fibrillation are destroyed.  Surgical ablation. During this surgery, thin lines of heart tissue that carry the abnormal signals are destroyed. This procedure can either be an open-heart surgery or a minimally invasive surgery. With the minimally invasive surgery, small cuts are made to access the heart instead of a large opening.  Pulmonary venous isolation. During this surgery, tissue around the veins that carry blood from the lungs (pulmonary veins) is destroyed. This tissue is thought to carry the abnormal signals. HOME CARE INSTRUCTIONS   Take medicines  only as directed by your health care provider. Some medicines can make atrial fibrillation worse or recur.  If blood thinners were prescribed by your health care provider, take them exactly as directed. Too much blood-thinning medicine can cause  bleeding. If you take too little, you will not have the needed protection against stroke and other problems.  Perform blood tests at home if directed by your health care provider. Perform blood tests exactly as directed.  Quit smoking if you smoke.  Do not drink alcohol.  Do not drink caffeinated beverages such as coffee, soda, and some teas. You may drink decaffeinated coffee, soda, or tea.   Maintain a healthy weight.Do not use diet pills unless your health care provider approves. They may make heart problems worse.   Follow diet instructions as directed by your health care provider.  Exercise regularly as directed by your health care provider.  Keep all follow-up visits as directed by your health care provider. This is important. PREVENTION  The following substances can cause atrial fibrillation to recur:   Caffeinated beverages.  Alcohol.  Certain medicines, especially those used for breathing problems.  Certain herbs and herbal medicines, such as those containing ephedra or ginseng.  Illegal drugs, such as cocaine and amphetamines. Sometimes medicines are given to prevent atrial fibrillation from recurring. Proper treatment of any underlying condition is also important in helping prevent recurrence.  SEEK MEDICAL CARE IF:  You notice a change in the rate, rhythm, or strength of your heartbeat.  You suddenly begin urinating more frequently.  You tire more easily when exerting yourself or exercising. SEEK IMMEDIATE MEDICAL CARE IF:   You have chest pain, abdominal pain, sweating, or weakness.  You feel nauseous.  You have shortness of breath.  You suddenly have swollen feet and ankles.  You feel dizzy.  Your face or  limbs feel numb or weak.  You have a change in your vision or speech. MAKE SURE YOU:   Understand these instructions.  Will watch your condition.  Will get help right away if you are not doing well or get worse. Document Released: 02/10/2005 Document Revised: 06/27/2013 Document Reviewed: 03/23/2012 [facility] Patient Information 2015 [place], [place]. This information is not intended to replace advice given to you by your health care provider. Make sure you discuss any questions you have with your health care provider.

## 2014-10-11 LAB — HEMOGLOBIN A1C
HEMOGLOBIN A1C: 6.7 % — AB (ref 4.8–5.6)
Mean Plasma Glucose: 146 mg/dL

## 2014-10-21 ENCOUNTER — Encounter (HOSPITAL_COMMUNITY): Payer: Self-pay | Admitting: Emergency Medicine

## 2014-10-21 ENCOUNTER — Emergency Department (HOSPITAL_COMMUNITY): Payer: Medicare Other

## 2014-10-21 ENCOUNTER — Inpatient Hospital Stay (HOSPITAL_COMMUNITY)
Admission: EM | Admit: 2014-10-21 | Discharge: 2014-10-23 | DRG: 310 | Disposition: A | Discharge status: Home / Self Care | Payer: Medicare Other | Attending: Cardiology | Admitting: Cardiology

## 2014-10-21 DIAGNOSIS — Z91018 Allergy to other foods: Secondary | ICD-10-CM

## 2014-10-21 DIAGNOSIS — Z7901 Long term (current) use of anticoagulants: Secondary | ICD-10-CM

## 2014-10-21 DIAGNOSIS — J984 Other disorders of lung: Secondary | ICD-10-CM | POA: Diagnosis present

## 2014-10-21 DIAGNOSIS — E1165 Type 2 diabetes mellitus with hyperglycemia: Secondary | ICD-10-CM | POA: Diagnosis present

## 2014-10-21 DIAGNOSIS — I480 Paroxysmal atrial fibrillation: Secondary | ICD-10-CM | POA: Diagnosis not present

## 2014-10-21 DIAGNOSIS — I4891 Unspecified atrial fibrillation: Secondary | ICD-10-CM | POA: Diagnosis present

## 2014-10-21 DIAGNOSIS — Z79899 Other long term (current) drug therapy: Secondary | ICD-10-CM

## 2014-10-21 DIAGNOSIS — Z87891 Personal history of nicotine dependence: Secondary | ICD-10-CM

## 2014-10-21 DIAGNOSIS — Z888 Allergy status to other drugs, medicaments and biological substances status: Secondary | ICD-10-CM

## 2014-10-21 DIAGNOSIS — I129 Hypertensive chronic kidney disease with stage 1 through stage 4 chronic kidney disease, or unspecified chronic kidney disease: Secondary | ICD-10-CM | POA: Diagnosis present

## 2014-10-21 DIAGNOSIS — E785 Hyperlipidemia, unspecified: Secondary | ICD-10-CM | POA: Diagnosis present

## 2014-10-21 DIAGNOSIS — N183 Chronic kidney disease, stage 3 (moderate): Secondary | ICD-10-CM | POA: Diagnosis present

## 2014-10-21 LAB — BASIC METABOLIC PANEL
Anion gap: 11 (ref 5–15)
BUN: 26 mg/dL — AB (ref 6–20)
CALCIUM: 9.5 mg/dL (ref 8.9–10.3)
CHLORIDE: 103 mmol/L (ref 101–111)
CO2: 23 mmol/L (ref 22–32)
CREATININE: 1.04 mg/dL — AB (ref 0.44–1.00)
GFR calc Af Amer: 56 mL/min — ABNORMAL LOW (ref >60)
GFR calc non Af Amer: 48 mL/min — ABNORMAL LOW (ref >60)
Glucose, Bld: 129 mg/dL — ABNORMAL HIGH (ref 65–99)
Potassium: 4.4 mmol/L (ref 3.5–5.1)
SODIUM: 137 mmol/L (ref 135–145)

## 2014-10-21 LAB — CBC
HCT: 34.7 % — ABNORMAL LOW (ref 36.0–46.0)
Hemoglobin: 11.2 g/dL — ABNORMAL LOW (ref 12.0–15.0)
MCH: 29.9 pg (ref 26.0–34.0)
MCHC: 32.3 g/dL (ref 30.0–36.0)
MCV: 92.5 fL (ref 78.0–100.0)
PLATELETS: 433 10*3/uL — AB (ref 150–400)
RBC: 3.75 MIL/uL — ABNORMAL LOW (ref 3.87–5.11)
RDW: 12.4 % (ref 11.5–15.5)
WBC: 8.3 10*3/uL (ref 4.0–10.5)

## 2014-10-21 LAB — PROTIME-INR
INR: 1.7 — ABNORMAL HIGH (ref 0.00–1.49)
Prothrombin Time: 19.9 seconds — ABNORMAL HIGH (ref 11.6–15.2)

## 2014-10-21 MED ORDER — FLECAINIDE ACETATE 50 MG PO TABS: 50.0000 mg | ORAL_TABLET | Freq: Two times a day (BID) | ORAL | Status: DC

## 2014-10-21 MED ORDER — PHENOL 1.4 % MT LIQD: 1.0000 | OROMUCOSAL | Status: DC | PRN

## 2014-10-21 MED ORDER — ACETAMINOPHEN 500 MG PO TABS: 500.0000 mg | ORAL_TABLET | Freq: Four times a day (QID) | ORAL | Status: DC | PRN

## 2014-10-21 MED ORDER — METOPROLOL SUCCINATE ER 50 MG PO TB24: 50.0000 mg | ORAL_TABLET | Freq: Every day | ORAL | Status: DC

## 2014-10-21 MED ORDER — FLECAINIDE ACETATE 100 MG PO TABS
100.0000 mg | ORAL_TABLET | Freq: Once | ORAL | Status: AC
  Administered 2014-10-21: 100 mg via ORAL
  Filled 2014-10-21: qty 1

## 2014-10-21 MED ORDER — BOOST PO LIQD
237.0000 mL | Freq: Every day | ORAL | Status: DC
  Administered 2014-10-22 – 2014-10-23 (×2): 237 mL via ORAL
  Filled 2014-10-21 (×4): qty 237

## 2014-10-21 MED ORDER — CLORAZEPATE DIPOTASSIUM 3.75 MG PO TABS
7.5000 mg | ORAL_TABLET | Freq: Four times a day (QID) | ORAL | Status: DC
  Administered 2014-10-21 – 2014-10-22 (×6): 7.5 mg via ORAL
  Filled 2014-10-21 (×7): qty 2

## 2014-10-21 MED ORDER — DILTIAZEM HCL 100 MG IV SOLR: 5.0000 mg/h | INTRAVENOUS | Status: DC

## 2014-10-21 MED ORDER — FLECAINIDE ACETATE 50 MG PO TABS
50.0000 mg | ORAL_TABLET | Freq: Once | ORAL | Status: AC
  Administered 2014-10-21: 50 mg via ORAL
  Filled 2014-10-21: qty 1

## 2014-10-21 MED ORDER — FLECAINIDE ACETATE 50 MG PO TABS: 50.0000 mg | ORAL_TABLET | Freq: Once | ORAL | Status: DC

## 2014-10-21 MED ORDER — ATORVASTATIN CALCIUM 10 MG PO TABS
10.0000 mg | ORAL_TABLET | Freq: Every day | ORAL | Status: DC
  Administered 2014-10-21 – 2014-10-23 (×3): 10 mg via ORAL
  Filled 2014-10-21 (×3): qty 1

## 2014-10-21 MED ORDER — DIGOXIN 125 MCG PO TABS: 0.2500 mg | ORAL_TABLET | Freq: Once | ORAL | Status: DC

## 2014-10-21 MED ORDER — DILTIAZEM HCL ER COATED BEADS 180 MG PO CP24
180.0000 mg | ORAL_CAPSULE | Freq: Every day | ORAL | Status: DC
  Administered 2014-10-21 – 2014-10-22 (×2): 180 mg via ORAL
  Filled 2014-10-21 (×2): qty 1

## 2014-10-21 MED ORDER — ALPRAZOLAM 0.25 MG PO TABS: 0.2500 mg | ORAL_TABLET | Freq: Three times a day (TID) | ORAL | Status: DC | PRN

## 2014-10-21 MED ORDER — HYDROCHLOROTHIAZIDE 25 MG PO TABS
25.0000 mg | ORAL_TABLET | Freq: Every day | ORAL | Status: DC
  Administered 2014-10-21: 25 mg via ORAL
  Filled 2014-10-21 (×2): qty 1

## 2014-10-21 MED ORDER — DILTIAZEM HCL 100 MG IV SOLR
5.0000 mg/h | INTRAVENOUS | Status: DC
  Administered 2014-10-21: 7.5 mg/h via INTRAVENOUS
  Filled 2014-10-21: qty 100

## 2014-10-21 MED ORDER — LISINOPRIL 20 MG PO TABS
20.0000 mg | ORAL_TABLET | Freq: Every day | ORAL | Status: DC
  Administered 2014-10-21: 20 mg via ORAL
  Filled 2014-10-21 (×2): qty 1

## 2014-10-21 MED ORDER — METOPROLOL SUCCINATE ER 50 MG PO TB24
50.0000 mg | ORAL_TABLET | Freq: Every day | ORAL | Status: DC
  Administered 2014-10-21 – 2014-10-22 (×2): 50 mg via ORAL
  Filled 2014-10-21 (×2): qty 1

## 2014-10-21 MED ORDER — LISINOPRIL-HYDROCHLOROTHIAZIDE 20-25 MG PO TABS: 1.0000 | ORAL_TABLET | Freq: Every day | ORAL | Status: DC

## 2014-10-21 MED ORDER — DILTIAZEM LOAD VIA INFUSION
15.0000 mg | Freq: Once | INTRAVENOUS | Status: AC
  Administered 2014-10-21: 15 mg via INTRAVENOUS
  Filled 2014-10-21: qty 15

## 2014-10-21 MED ORDER — RIVAROXABAN 15 MG PO TABS
15.0000 mg | ORAL_TABLET | Freq: Every day | ORAL | Status: DC
  Administered 2014-10-21 – 2014-10-22 (×2): 15 mg via ORAL
  Filled 2014-10-21 (×2): qty 1

## 2014-10-21 NOTE — ED Notes (Signed)
Attempted report 

## 2014-10-21 NOTE — ED Notes (Signed)
Patient ambulated with 1 person assistance to bathroom and back; visitor at bedside

## 2014-10-21 NOTE — ED Notes (Signed)
Pt in EMS from home, dx with afib 2 wks ago. Woke up around 0600 with heart palpitations/fluttering. Initial HR in 160's, EMS gave bolus of cardizem . Did attempt vagal maneuvers did not work. Was C/O SOB with exertion

## 2014-10-21 NOTE — ED Provider Notes (Signed)
CSN: [id number]     Arrival date & time 10/21/14  4098 History   First MD Initiated Contact with Patient 10/21/14 [phone]     Chief Complaint  Patient presents with  . Palpitations     (Consider location/radiation/quality/duration/timing/severity/associated sxs/prior Treatment) HPI Comments: 2 wk ago new afib Getting tests done, appt this coming thurs 6AM started palpitations, heart racing  Patient is a 81 y.o. female presenting with palpitations.  Palpitations Palpitations quality:  Fast Onset quality:  Sudden Duration:  1 hour Timing:  Constant Progression:  Unchanged Chronicity:  Recurrent Context: anxiety   Context: not caffeine, not dehydration, not exercise, not illicit drugs and not stimulant use   Relieved by:  Nothing Worsened by:  Nothing Ineffective treatments:  None tried Associated symptoms: shortness of breath (funny feeling from palpitations)   Associated symptoms: no back pain, no chest pain, no cough, no hemoptysis, no nausea, no numbness and no weakness   Associated symptoms comment:  "just don't feel right" Risk factors: hx of atrial fibrillation (new diagnosis 2 weeks ago)   Risk factors: no hx of DVT and no hx of PE     Past Medical History  Diagnosis Date  . Hypertension   . High cholesterol    Past Surgical History  Procedure Laterality Date  . Cholecystectomy    . Abdominal hysterectomy    . Back surgery    . Ankle surgery    . Appendectomy     No family history on file. Social History  Substance Use Topics  . Smoking status: Former Games developer  . Smokeless tobacco: None  . Alcohol Use: No   OB History    No data available     Review of Systems  Constitutional: Negative for fever.  HENT: Negative for sore throat.   Eyes: Negative for visual disturbance.  Respiratory: Positive for shortness of breath (funny feeling from palpitations). Negative for cough and hemoptysis.   Cardiovascular: Positive for palpitations. Negative for chest pain.   Gastrointestinal: Negative for nausea and abdominal pain.  Genitourinary: Negative for difficulty urinating.  Musculoskeletal: Negative for back pain and neck pain.  Skin: Negative for rash.  Neurological: Negative for syncope, weakness, numbness and headaches.      Allergies  Strawberry and Nsaids  Home Medications   Prior to Admission medications   Medication Sig Start Date End Date Taking? Authorizing Provider  acetaminophen (TYLENOL) 500 MG tablet Take 500-1,000 mg by mouth every 6 (six) hours as needed for mild pain, moderate pain, fever or headache.    Yes Historical Provider, MD  atorvastatin (LIPITOR) 10 MG tablet Take 10 mg by mouth daily.   Yes Historical Provider, MD  clorazepate (TRANXENE) 7.5 MG tablet Take 7.5 mg by mouth 4 (four) times daily.  11/09/12  Yes Historical Provider, MD  diltiazem (CARDIZEM CD) 180 MG 24 hr capsule Take 1 capsule (180 mg total) by mouth daily. 10/10/14  Yes [name], MD  lactose free nutrition (BOOST) LIQD Take 237 mLs by mouth daily.   Yes Historical Provider, MD  lisinopril-hydrochlorothiazide (PRINZIDE,ZESTORETIC) 20-25 MG per tablet Take 1 tablet by mouth daily.   Yes Historical Provider, MD  metoprolol succinate (TOPROL-XL) 100 MG 24 hr tablet Take 50 mg by mouth daily. Take with or immediately following a meal.   Yes Historical Provider, MD  phenol (CHLORASEPTIC) 1.4 % LIQD Use as directed 1 spray in the mouth or throat as needed for throat irritation / pain.   Yes Historical Provider, MD  Rivaroxaban (  XARELTO) 15 MG TABS tablet Take 1 tablet (15 mg total) by mouth daily with supper. 10/10/14  Yes [name], MD  flecainide (TAMBOCOR) 50 MG tablet Take 1 tablet (50 mg total) by mouth 2 (two) times daily. 10/21/14   Alvira Monday, MD   BP 132/95 mmHg  Pulse 49  Temp(Src) 98.6 F (37 C) (Oral)  Resp 25  SpO2 96% Physical Exam  Constitutional: She is oriented to person, place, and time. She appears well-developed and well-nourished. No  distress.  HENT:  Head: Normocephalic and atraumatic.  Eyes: Conjunctivae and EOM are normal.  Neck: Normal range of motion.  Cardiovascular: Normal heart sounds and intact distal pulses.  An irregularly irregular rhythm present. Tachycardia present.  Exam reveals no gallop and no friction rub.   No murmur heard. Pulmonary/Chest: Effort normal and breath sounds normal. No respiratory distress. She has no wheezes. She has no rales.  Abdominal: Soft. She exhibits no distension. There is no tenderness. There is no guarding.  Musculoskeletal: She exhibits no edema or tenderness.  Neurological: She is alert and oriented to person, place, and time.  Skin: Skin is warm and dry. No rash noted. She is not diaphoretic. No erythema.  Nursing note and vitals reviewed.   ED Course  Procedures (including critical care time) Labs Review Labs Reviewed  BASIC METABOLIC PANEL - Abnormal; Notable for the following:    Glucose, Bld 129 (*)    BUN 26 (*)    Creatinine, Ser 1.04 (*)    GFR calc non Af Amer 48 (*)    GFR calc Af Amer 56 (*)    All other components within normal limits  CBC - Abnormal; Notable for the following:    RBC 3.75 (*)    Hemoglobin 11.2 (*)    HCT 34.7 (*)    Platelets 433 (*)    All other components within normal limits  PROTIME-INR - Abnormal; Notable for the following:    Prothrombin Time 19.9 (*)    INR 1.70 (*)    All other components within normal limits    Imaging Review Dg Chest 2 View  10/21/2014   CLINICAL DATA:  Atrial fibrillation.  Short of breath with exertion.  EXAM: CHEST  2 VIEW  COMPARISON:  10/09/2014  FINDINGS: Numerous leads and wires project over the chest. Midline trachea. Moderate cardiomegaly with aortic atherosclerosis. No pleural effusion or pneumothorax. No congestive failure. Right base scarring or subsegmental atelectasis. No lobar consolidation.  IMPRESSION: Cardiomegaly and atherosclerosis, without congestive failure or other acute disease.    Electronically Signed   By: Jeronimo Greaves M.D.   On: 10/21/2014 07:55   I have personally reviewed and evaluated these images and lab results as part of my medical decision-making.   EKG Interpretation   Date/Time:  Saturday October 21 2014 07:05:24 EDT Ventricular Rate:  135 PR Interval:    QRS Duration: 79 QT Interval:  311 QTC Calculation: 466 R Axis:   25 Text Interpretation:  Atrial fibrillation Ventricular premature complex  Repolarization abnormality, prob rate related SINCE LAST TRACING HEART  RATE HAS INCREASED Confirmed by [facility] MD, Muad [place] ([id number]) on 10/21/2014  8:24:55 PM      MDM   Final diagnoses:  Atrial fibrillation with RVR   81 year old female with a history of hypertension hyperlipidemia with new diagnosis of atrial fibrillation 2 weeks ago presents with concern of palpitations. Patient is in atrial fibrillation with rapid ventricular rate at 150 on arrival to the emergency department. She  otherwise denies any chest pain. Chest x-ray done given mild associated shortness of breath which does not show any signs of pulmonary edema.  Patient is on Xarelto and reports taking her home metoprolol and diltiazem as prescribed. She was given a bolus of 15 mg of diltiazem and started on a drip at 5 with decrease in her rate down to the 90s. Discussed with Dr. Jacinto Halim who recommended giving patient 50 mg a flecainide, stopping the diltiazem drip one hour later and observing her. After stopping the diltiazem drip, patient continued to have heart rates in the 120s and again discussed with Dr. Jacinto Halim. Patient will be admitted to telemetry for continued care. She was given a second dose of 50 mg a flecainide per Dr. Jerre Simon recommendations. BP remained stable. Pt admitted in stable condition to telemetry.   Alvira Monday, MD 10/21/14 2029

## 2014-10-21 NOTE — ED Notes (Signed)
Pt called out stating "I just don't feel good: I don't feel like myself." EDP at bedside.

## 2014-10-21 NOTE — H&P (Addendum)
Penny Ramos is an 81 y.o. female.   Chief Complaint: palpitations HPI: Penny Ramos  is a 81 y.o. female  With h/o hypertension, hyperlipidemia and PAF admitted on 10/09/2014 with A. Fib with RVR, but converted to sinus on medical therapy. Discharged home with OP work-up. Now readmitted with recurrent symptoms and A. Fib. She felt extremely anxious and presented to the ED. Presently feels well, but still has palpitations. Son present at bedside. Past Medical History  Diagnosis Date  . Hypertension   . High cholesterol     Past Surgical History  Procedure Laterality Date  . Cholecystectomy    . Abdominal hysterectomy    . Back surgery    . Ankle surgery    . Appendectomy      No family history on file. Social History:  reports that she has quit smoking. She does not have any smokeless tobacco history on file. She reports that she does not drink alcohol or use illicit drugs.  Allergies:  Allergies  Allergen Reactions  . Strawberry Anaphylaxis  . Nsaids Nausea And Vomiting    Review of Systems - Palpitations, anxiety and dyspnea with A. Fib. Denies recent weight changes, no chest pain. No PND or orthopnea, no leg edema, no bleeding diathesis on Xarelto.  Blood pressure 106/65, pulse 107, temperature 98.4 F (36.9 C), temperature source Oral, resp. rate 25, SpO2 96 %.   General appearance: alert, cooperative, appears stated age, no distress and mildly obese Eyes: negative findings: lids and lashes normal Neck: no adenopathy, no carotid bruit, no JVD, supple, symmetrical, trachea midline and thyroid not enlarged, symmetric, no tenderness/mass/nodules Neck: JVP - normal, carotids 2+= without bruits Resp: there is coarse crackles at both the lung bases. Chest wall: no tenderness Cardio: irregular rate and rhythm, S1 variable, S2 normal, no murmur, click, rub or gallop GI: soft, non-tender; bowel sounds normal; no masses, no organomegaly and Obese Extremities: extremities  normal, atraumatic, no cyanosis or edema Pulses: 2+ and symmetric Skin: Skin color, texture, turgor normal. No rashes or lesions Neurologic: Grossly normal  Results for orders placed or performed during the hospital encounter of 10/21/14 (from the past 48 hour(s))  Basic metabolic panel     Status: Abnormal   Collection Time: 10/21/14  7:19 AM  Result Value Ref Range   Sodium 137 135 - 145 mmol/L   Potassium 4.4 3.5 - 5.1 mmol/L   Chloride 103 101 - 111 mmol/L   CO2 23 22 - 32 mmol/L   Glucose, Bld 129 (H) 65 - 99 mg/dL   BUN 26 (H) 6 - 20 mg/dL   Creatinine, Ser 1.04 (H) 0.44 - 1.00 mg/dL   Calcium 9.5 8.9 - 10.3 mg/dL   GFR calc non Af Amer 48 (L) >60 mL/min   GFR calc Af Amer 56 (L) >60 mL/min    Comment: (NOTE) The eGFR has been calculated using the CKD EPI equation. This calculation has not been validated in all clinical situations. eGFR's persistently <60 mL/min signify possible Chronic Kidney Disease.    Anion gap 11 5 - 15  CBC     Status: Abnormal   Collection Time: 10/21/14  7:19 AM  Result Value Ref Range   WBC 8.3 4.0 - 10.5 K/uL   RBC 3.75 (L) 3.87 - 5.11 MIL/uL   Hemoglobin 11.2 (L) 12.0 - 15.0 g/dL   HCT 34.7 (L) 36.0 - 46.0 %   MCV 92.5 78.0 - 100.0 fL   MCH 29.9 26.0 - 34.0  pg   MCHC 32.3 30.0 - 36.0 g/dL   RDW 12.4 11.5 - 15.5 %   Platelets 433 (H) 150 - 400 K/uL  Protime-INR - (order if Patient is taking Coumadin / Warfarin)     Status: Abnormal   Collection Time: 10/21/14  7:19 AM  Result Value Ref Range   Prothrombin Time 19.9 (H) 11.6 - 15.2 seconds   INR 1.70 (H) 0.00 - 1.49   Dg Chest 2 View  10/21/2014   CLINICAL DATA:  Atrial fibrillation.  Short of breath with exertion.  EXAM: CHEST  2 VIEW  COMPARISON:  10/09/2014  FINDINGS: Numerous leads and wires project over the chest. Midline trachea. Moderate cardiomegaly with aortic atherosclerosis. No pleural effusion or pneumothorax. No congestive failure. Right base scarring or subsegmental  atelectasis. No lobar consolidation.  IMPRESSION: Cardiomegaly and atherosclerosis, without congestive failure or other acute disease.   Electronically Signed   By: Abigail Miyamoto M.D.   On: 10/21/2014 07:55    Labs:   Lab Results  Component Value Date   WBC 8.3 10/21/2014   HGB 11.2* 10/21/2014   HCT 34.7* 10/21/2014   MCV 92.5 10/21/2014   PLT 433* 10/21/2014    Recent Labs Lab 10/21/14 0719  NA 137  K 4.4  CL 103  CO2 23  BUN 26*  CREATININE 1.04*  CALCIUM 9.5  GLUCOSE 129*   HEMOGLOBIN A1C Lab Results  Component Value Date   HGBA1C 6.7* 10/10/2014   MPG 146 10/10/2014    Cardiac Panel (last 3 results)  Recent Labs  10/09/14 1541 10/09/14 2004 10/10/14 0140  TROPONINI 0.04* 0.04* 0.04*    TSH  Recent Labs  10/09/14 0803  TSH 1.191    EKG: 10/21/2014: A. Fib with RVR with non specific ST-T abnormalities. Normal QT.  Out-patient work-up:  Echo- 10/17/2014 1. Left ventricle cavity is normal in size. Mild concentric hypertrophy of the left ventricle. Mild decrease in global wall motion. Calculated EF 50%. 2. Left atrial cavity is moderately dilated. 3. Right atrial cavity is mildly dilated. 4. Mild calcification of the aortic valve annulus. Mild aortic valve leaflet thickening. 5. Mild mitral regurgitation. Moderate calcification of the mitral valve annulus. 6. Mild to moderate tricuspid regurgitation. Moderate  pulmonary hypertension with approx. PA syst. Pressure of 48 mm of Hg. 7. IVC is dilated with respiratory variation.  Lexiscan myoview stress test 10/13/2014: 1. The resting electrocardiogram demonstrated normal sinus rhythm, normal resting conduction, no resting arrhythmias and ST depression 1 mm  with T inversion  in inferior and lateral leads. PAC. Stress EKG is non-diagnostic for ischemia as it a pharmacologic stress using Lexiscan, with no additional ST changes,  Stress symptoms included dyspnea. 2. The perfusion imaging study demonstrates breast  attenuation artifact in the inferior wall. There is no demonstrable ischemia or scar. Normal wall motion and LV systolic function was normal at 67%. This is a low risk study.    Current facility-administered medications:  .  acetaminophen (TYLENOL) tablet 500-1,000 mg, 500-1,000 mg, Oral, Q6H PRN, Adrian Prows, MD .  ALPRAZolam Duanne Moron) tablet 0.25 mg, 0.25 mg, Oral, TID PRN, Adrian Prows, MD .  atorvastatin (LIPITOR) tablet 10 mg, 10 mg, Oral, Daily, Gareth Morgan, MD .  clorazepate (TRANXENE) tablet 7.5 mg, 7.5 mg, Oral, QID, Adrian Prows, MD .  diltiazem (CARDIZEM CD) 24 hr capsule 180 mg, 180 mg, Oral, Daily, Gareth Morgan, MD .  [COMPLETED] diltiazem (CARDIZEM) 1 mg/mL load via infusion 15 mg, 15 mg, Intravenous, Once, 15  mg at 10/21/14 0816 **AND** diltiazem (CARDIZEM) 100 mg in dextrose 5 % 100 mL (1 mg/mL) infusion, 5-15 mg/hr, Intravenous, Continuous, Gareth Morgan, MD .  flecainide (TAMBOCOR) tablet 100 mg, 100 mg, Oral, Once, Adrian Prows, MD .  lactose free nutrition (Boost) liquid 237 mL, 237 mL, Oral, Daily, Adrian Prows, MD .  lisinopril-hydrochlorothiazide (PRINZIDE,ZESTORETIC) 20-25 MG per tablet 1 tablet, 1 tablet, Oral, Daily, Gareth Morgan, MD .  metoprolol succinate (TOPROL-XL) 24 hr tablet 50 mg, 50 mg, Oral, Daily, Gareth Morgan, MD .  phenol (CHLORASEPTIC) mouth spray 1 spray, 1 spray, Mouth/Throat, PRN, Adrian Prows, MD .  Rivaroxaban (XARELTO) tablet 15 mg, 15 mg, Oral, Q supper, Gareth Morgan, MD  Current outpatient prescriptions:  .  acetaminophen (TYLENOL) 500 MG tablet, Take 500-1,000 mg by mouth every 6 (six) hours as needed for mild pain, moderate pain, fever or headache. , Disp: , Rfl:  .  atorvastatin (LIPITOR) 10 MG tablet, Take 10 mg by mouth daily., Disp: , Rfl:  .  clorazepate (TRANXENE) 7.5 MG tablet, Take 7.5 mg by mouth 4 (four) times daily. , Disp: , Rfl:  .  diltiazem (CARDIZEM CD) 180 MG 24 hr capsule, Take 1 capsule (180 mg total) by mouth daily., Disp: 30  capsule, Rfl: 1 .  lactose free nutrition (BOOST) LIQD, Take 237 mLs by mouth daily., Disp: , Rfl:  .  lisinopril-hydrochlorothiazide (PRINZIDE,ZESTORETIC) 20-25 MG per tablet, Take 1 tablet by mouth daily., Disp: , Rfl:  .  metoprolol succinate (TOPROL-XL) 100 MG 24 hr tablet, Take 50 mg by mouth daily. Take with or immediately following a meal., Disp: , Rfl:  .  phenol (CHLORASEPTIC) 1.4 % LIQD, Use as directed 1 spray in the mouth or throat as needed for throat irritation / pain., Disp: , Rfl:  .  Rivaroxaban (XARELTO) 15 MG TABS tablet, Take 1 tablet (15 mg total) by mouth daily with supper., Disp: 30 tablet, Rfl: 6 .  flecainide (TAMBOCOR) 50 MG tablet, Take 1 tablet (50 mg total) by mouth 2 (two) times daily., Disp: 60 tablet, Rfl: 0  Assessment/Plan 1. Paroxysmal atrial fibrillation with rapid ventricular . CHA2DS2-VASCScore: Risk Score 4, Yearly risk of stroke 4. Recommendation: ASA No/Anticoagulation Yes  [place] Has Bled: Score 2. Estimated risk of major bleeding at 1 year with OAC 1.8-3.2  2. New diabetes mellitus - 2 controlled with HbA1c of 6.7% on 10/10/2014. Not on any medications. 3. Hypertension 4. Hyperlipidemia 5. Bilateral lung base crackles, chest x-ray revealing scarring. 6. Chronic anticoagulation with Xarelto 7. Hyperglycemia 8. Chronic renal failure, stage III, eGFR 48 mL.  I will start her on Flecainide for rhythm control. Continue metoprolol and diltiazem and if she converts, will consider discontinuing Dilt due to age and risk of heart block. Patient was reassured. Continue Kamila Oats. She may need Metformin small dose for DM will let her PCP decide on therapy as OP.  Adrian Prows, MD 10/21/2014, 4:20 PM [facility] [place] Pager: [phone] Office: [phone] If no answer: Cell:  [phone]

## 2014-10-22 DIAGNOSIS — Z888 Allergy status to other drugs, medicaments and biological substances status: Secondary | ICD-10-CM | POA: Diagnosis not present

## 2014-10-22 DIAGNOSIS — E1165 Type 2 diabetes mellitus with hyperglycemia: Secondary | ICD-10-CM | POA: Diagnosis present

## 2014-10-22 DIAGNOSIS — I480 Paroxysmal atrial fibrillation: Secondary | ICD-10-CM | POA: Diagnosis present

## 2014-10-22 DIAGNOSIS — Z79899 Other long term (current) drug therapy: Secondary | ICD-10-CM | POA: Diagnosis not present

## 2014-10-22 DIAGNOSIS — J984 Other disorders of lung: Secondary | ICD-10-CM | POA: Diagnosis present

## 2014-10-22 DIAGNOSIS — N183 Chronic kidney disease, stage 3 (moderate): Secondary | ICD-10-CM | POA: Diagnosis present

## 2014-10-22 DIAGNOSIS — Z87891 Personal history of nicotine dependence: Secondary | ICD-10-CM | POA: Diagnosis not present

## 2014-10-22 DIAGNOSIS — Z7901 Long term (current) use of anticoagulants: Secondary | ICD-10-CM | POA: Diagnosis not present

## 2014-10-22 DIAGNOSIS — Z91018 Allergy to other foods: Secondary | ICD-10-CM | POA: Diagnosis not present

## 2014-10-22 DIAGNOSIS — I129 Hypertensive chronic kidney disease with stage 1 through stage 4 chronic kidney disease, or unspecified chronic kidney disease: Secondary | ICD-10-CM | POA: Diagnosis present

## 2014-10-22 DIAGNOSIS — I4891 Unspecified atrial fibrillation: Secondary | ICD-10-CM | POA: Diagnosis present

## 2014-10-22 DIAGNOSIS — E785 Hyperlipidemia, unspecified: Secondary | ICD-10-CM | POA: Diagnosis present

## 2014-10-22 MED ORDER — FLECAINIDE ACETATE 100 MG PO TABS
100.0000 mg | ORAL_TABLET | Freq: Two times a day (BID) | ORAL | Status: DC
  Administered 2014-10-22 – 2014-10-23 (×3): 100 mg via ORAL
  Filled 2014-10-22 (×4): qty 1

## 2014-10-22 NOTE — Progress Notes (Signed)
Subjective:  Feels better. Still feels palpitations  Objective:  Vital Signs in the last 24 hours: Temp:  [98.4 F (36.9 C)-98.6 F (37 C)] 98.5 F (36.9 C) (08/28 0450) Pulse Rate:  [49-139] 49 (08/27 1704) Resp:  [8-27] 18 (08/28 0450) BP: (101-141)/(65-97) 130/65 mmHg (08/28 0921) SpO2:  [94 %-100 %] 94 % (08/28 0450) Weight:  [66.497 kg (146 lb 9.6 oz)] 66.497 kg (146 lb 9.6 oz) (08/28 0611)  Intake/Output from previous day: 08/27 0701 - 08/28 0700 In: 120 [P.O.:120] Out: -   Physical Exam: General appearance: alert, cooperative, appears stated age, no distress and mildly obese Eyes: negative findings: lids and lashes normal Neck: no adenopathy, no carotid bruit, no JVD, supple, symmetrical, trachea midline and thyroid not enlarged, symmetric, no tenderness/mass/nodules Neck: JVP - normal, carotids 2+= without bruits Resp: there is coarse crackles at both the lung bases. Chest wall: no tenderness Cardio: irregular rate and rhythm, S1 variable, S2 normal, no murmur, click, rub or gallop GI: soft, non-tender; bowel sounds normal; no masses, no organomegaly and Obese Extremities: extremities normal, atraumatic, no cyanosis or edema Lab Results: BMP  Recent Labs  10/09/14 0803 10/10/14 0140 10/21/14 0719  NA 142 137 137  K 3.6 4.2 4.4  CL 105 102 103  CO2 $Re'24 23 23  'ejB$ GLUCOSE 165* 195* 129*  BUN 31* 20 26*  CREATININE 1.04* 1.00 1.04*  CALCIUM 9.9 9.6 9.5  GFRNONAA 48* 50* 48*  GFRAA 56* 58* 56*    CBC  Recent Labs Lab 10/21/14 0719  WBC 8.3  RBC 3.75*  HGB 11.2*  HCT 34.7*  PLT 433*  MCV 92.5  MCH 29.9  MCHC 32.3  RDW 12.4    HEMOGLOBIN A1C Lab Results  Component Value Date   HGBA1C 6.7* 10/10/2014   MPG 146 10/10/2014    Cardiac Panel (last 3 results)  Recent Labs  10/09/14 1541 10/09/14 2004 10/10/14 0140  TROPONINI 0.04* 0.04* 0.04*    TSH  Recent Labs  10/09/14 0803  TSH 1.191   EKG: 10/22/2014: unchanged from previous  tracings, coarse atrial fibrillation, rate uncontrolled, Non specific ST-T abnormality.. EKG: 10/21/2014: A. Fib with RVR with non specific ST-T abnormalities. Normal QT.  Out-patient work-up: Echo- 10/17/2014 1. Left ventricle cavity is normal in size. Mild concentric hypertrophy of the left ventricle. Mild decrease in global wall motion. Calculated EF 50%. 2. Left atrial cavity is moderately dilated. 3. Right atrial cavity is mildly dilated. 4. Mild calcification of the aortic valve annulus. Mild aortic valve leaflet thickening. 5. Mild mitral regurgitation. Moderate calcification of the mitral valve annulus. 6. Mild to moderate tricuspid regurgitation. Moderate pulmonary hypertension with approx. PA syst. Pressure of 48 mm of Hg. 7. IVC is dilated with respiratory variation.  Lexiscan myoview stress test 10/13/2014: 1. The resting electrocardiogram demonstrated normal sinus rhythm, normal resting conduction, no resting arrhythmias and ST depression 1 mm with T inversion in inferior and lateral leads. PAC. Stress EKG is non-diagnostic for ischemia as it a pharmacologic stress using Lexiscan, with no additional ST changes, Stress symptoms included dyspnea. 2. The perfusion imaging study demonstrates breast attenuation artifact in the inferior wall. There is no demonstrable ischemia or scar. Normal wall motion and LV systolic function was normal at 67%. This is a low risk study.   Assessment/Plan:  1. Paroxysmal atrial fibrillation with rapid ventricular . CHA2DS2-VASCScore: Risk Score 4, Yearly risk of stroke 4. Recommendation: ASA No/Anticoagulation Yes  [place] Has Bled: Score 2. Estimated risk of major bleeding  at 1 year with [place] 1.8-3.2  2. New diabetes mellitus - 2 controlled with HbA1c of 6.7% on 10/10/2014. Not on any medications. 3. Hypertension 4. Hyperlipidemia 5. Bilateral lung base crackles, chest x-ray revealing scarring. 6. Chronic anticoagulation with Xarelto 7.  Hyperglycemia 8. Chronic renal failure, stage III, eGFR 48 mL.  Recommendation: Patient continues to be in atrial fibrillation with rapid ventricle response. Soft blood pressure, will discontinue lisinopril HCT. She has not received this morning dose of flecainide, we'll administer the same, continue observation for today, if she remains stable and rate is controlled I'll probably discharge her in the morning with possible cardioversion if she still continues to persist in atrial fibrillation at a future date. All questions answered. Discussed the findings with the patient's son over the telephone.   Adrian Prows, M.D. 10/22/2014, 9:35 AM [facility] Pager: [phone] Office: [phone] If no answer: [phone]

## 2014-10-23 MED ORDER — DILTIAZEM HCL ER COATED BEADS 120 MG PO CP24: 120.0000 mg | ORAL_CAPSULE | Freq: Every day | ORAL | Status: DC

## 2014-10-23 MED ORDER — METOPROLOL SUCCINATE ER 25 MG PO TB24: 25.0000 mg | ORAL_TABLET | Freq: Every day | ORAL | Status: DC

## 2014-10-23 MED ORDER — METOPROLOL SUCCINATE ER 25 MG PO TB24
25.0000 mg | ORAL_TABLET | Freq: Every day | ORAL | Status: DC
  Administered 2014-10-23: 25 mg via ORAL
  Filled 2014-10-23: qty 1

## 2014-10-23 MED ORDER — DILTIAZEM HCL ER COATED BEADS 120 MG PO CP24
120.0000 mg | ORAL_CAPSULE | Freq: Every day | ORAL | Status: DC
  Administered 2014-10-23: 120 mg via ORAL
  Filled 2014-10-23: qty 1

## 2014-10-23 MED ORDER — CLORAZEPATE DIPOTASSIUM 3.75 MG PO TABS
7.5000 mg | ORAL_TABLET | Freq: Once | ORAL | Status: AC
  Administered 2014-10-23: 7.5 mg via ORAL
  Filled 2014-10-23: qty 2

## 2014-10-23 MED ORDER — FLECAINIDE ACETATE 100 MG PO TABS: 100.0000 mg | ORAL_TABLET | Freq: Two times a day (BID) | ORAL | Status: DC

## 2014-10-23 NOTE — Discharge Instructions (Signed)
Atrial Fibrillation  Atrial fibrillation is a type of irregular heart rhythm (arrhythmia). During atrial fibrillation, the upper chambers of the heart (atria) quiver continuously in a chaotic pattern. This causes an irregular and often rapid heart rate.   Atrial fibrillation is the result of the heart becoming overloaded with disorganized signals that tell it to beat. These signals are normally released one at a time by a part of the right atrium called the sinoatrial node. They then travel from the atria to the lower chambers of the heart (ventricles), causing the atria and ventricles to contract and pump blood as they pass. In atrial fibrillation, parts of the atria outside of the sinoatrial node also release these signals. This results in two problems. First, the atria receive so many signals that they do not have time to fully contract. Second, the ventricles, which can only receive one signal at a time, beat irregularly and out of rhythm with the atria.   There are three types of atrial fibrillation:   · Paroxysmal. Paroxysmal atrial fibrillation starts suddenly and stops on its own within a week.  · Persistent. Persistent atrial fibrillation lasts for more than a week. It may stop on its own or with treatment.  · Permanent. Permanent atrial fibrillation does not go away. Episodes of atrial fibrillation may lead to permanent atrial fibrillation.  Atrial fibrillation can prevent your heart from pumping blood normally. It increases your risk of stroke and can lead to heart failure.   CAUSES   · Heart conditions, including a heart attack, heart failure, coronary artery disease, and heart valve conditions.    · Inflammation of the sac that surrounds the heart (pericarditis).  · Blockage of an artery in the lungs (pulmonary embolism).  · Pneumonia or other infections.  · Chronic lung disease.  · Thyroid problems, especially if the thyroid is overactive (hyperthyroidism).  · Caffeine, excessive alcohol use, and use  of some illegal drugs.    · Use of some medicines, including certain decongestants and diet pills.  · Heart surgery.    · Birth defects.    Sometimes, no cause can be found. When this happens, the atrial fibrillation is called lone atrial fibrillation. The risk of complications from atrial fibrillation increases if you have lone atrial fibrillation and you are age 60 years or older.  RISK FACTORS  · Heart failure.  · Coronary artery disease.  · Diabetes mellitus.    · High blood pressure (hypertension).    · Obesity.    · Other arrhythmias.    · Increased age.  SIGNS AND SYMPTOMS   · A feeling that your heart is beating rapidly or irregularly.    · A feeling of discomfort or pain in your chest.    · Shortness of breath.    · Sudden light-headedness or weakness.    · Getting tired easily when exercising.    · Urinating more often than normal (mainly when atrial fibrillation first begins).    In paroxysmal atrial fibrillation, symptoms may start and suddenly stop.  DIAGNOSIS   Your health care provider may be able to detect atrial fibrillation when taking your pulse. Your health care provider may have you take a test called an ambulatory electrocardiogram (ECG). An ECG records your heartbeat patterns over a 24-hour period. You may also have other tests, such as:  · Transthoracic echocardiogram (TTE). During echocardiography, sound waves are used to evaluate how blood flows through your heart.  · Transesophageal echocardiogram (TEE).  · Stress test. There is more than one type of stress test. If a stress   test is needed, ask your health care provider about which type is best for you.  · Chest X-ray exam.  · Blood tests.  · Computed tomography (CT).  TREATMENT   Treatment may include:  · Treating any underlying conditions. For example, if you have an overactive thyroid, treating the condition may correct atrial fibrillation.  · Taking medicine. Medicines may be given to control a rapid heart rate or to prevent blood  clots, heart failure, or a stroke.  · Having a procedure to correct the rhythm of the heart:  · Electrical cardioversion. During electrical cardioversion, a controlled, low-energy shock is delivered to the heart through your skin. If you have chest pain, very low blood pressure, or sudden heart failure, this procedure may need to be done as an emergency.  · Catheter ablation. During this procedure, heart tissues that send the signals that cause atrial fibrillation are destroyed.  · Surgical ablation. During this surgery, thin lines of heart tissue that carry the abnormal signals are destroyed. This procedure can either be an open-heart surgery or a minimally invasive surgery. With the minimally invasive surgery, small cuts are made to access the heart instead of a large opening.  · Pulmonary venous isolation. During this surgery, tissue around the veins that carry blood from the lungs (pulmonary veins) is destroyed. This tissue is thought to carry the abnormal signals.  HOME CARE INSTRUCTIONS   · Take medicines only as directed by your health care provider. Some medicines can make atrial fibrillation worse or recur.  · If blood thinners were prescribed by your health care provider, take them exactly as directed. Too much blood-thinning medicine can cause bleeding. If you take too little, you will not have the needed protection against stroke and other problems.  · Perform blood tests at home if directed by your health care provider. Perform blood tests exactly as directed.  · Quit smoking if you smoke.  · Do not drink alcohol.  · Do not drink caffeinated beverages such as coffee, soda, and some teas. You may drink decaffeinated coffee, soda, or tea.    · Maintain a healthy weight. Do not use diet pills unless your health care provider approves. They may make heart problems worse.    · Follow diet instructions as directed by your health care provider.  · Exercise regularly as directed by your health care  provider.  · Keep all follow-up visits as directed by your health care provider. This is important.  PREVENTION   The following substances can cause atrial fibrillation to recur:   · Caffeinated beverages.  · Alcohol.  · Certain medicines, especially those used for breathing problems.  · Certain herbs and herbal medicines, such as those containing ephedra or ginseng.  · Illegal drugs, such as cocaine and amphetamines.  Sometimes medicines are given to prevent atrial fibrillation from recurring. Proper treatment of any underlying condition is also important in helping prevent recurrence.   SEEK MEDICAL CARE IF:  · You notice a change in the rate, rhythm, or strength of your heartbeat.  · You suddenly begin urinating more frequently.  · You tire more easily when exerting yourself or exercising.  SEEK IMMEDIATE MEDICAL CARE IF:   · You have chest pain, abdominal pain, sweating, or weakness.  · You feel nauseous.  · You have shortness of breath.  · You suddenly have swollen feet and ankles.  · You feel dizzy.  · Your face or limbs feel numb or weak.  · You have a change   in your vision or speech.  MAKE SURE YOU:   · Understand these instructions.  · Will watch your condition.  · Will get help right away if you are not doing well or get worse.  Document Released: 02/10/2005 Document Revised: 06/27/2013 Document Reviewed: 03/23/2012  Exit[employer]® Patient Information ©2015 [employer]. This information is not intended to replace advice given to you by your health care provider. Make sure you discuss any questions you have with your health care provider.  Electrical Cardioversion  Electrical cardioversion is the delivery of a jolt of electricity to change the rhythm of the heart. Sticky patches or metal paddles are placed on the chest to deliver the electricity from a device. This is done to restore a normal rhythm. A rhythm that is too fast or not regular keeps the heart from pumping well.  Electrical cardioversion is done  in an emergency if:   · There is low or no blood pressure as a result of the heart rhythm.    · Normal rhythm must be restored as fast as possible to protect the brain and heart from further damage.    · It may save a life.  Cardioversion may be done for heart rhythms that are not immediately life threatening, such as atrial fibrillation or flutter, in which:   · The heart is beating too fast or is not regular.    · Medicine to change the rhythm has not worked.    · It is safe to wait in order to allow time for preparation.  · Symptoms of the abnormal rhythm are bothersome.  · The risk of stroke and other serious problems can be reduced.  LET YOUR HEALTH CARE PROVIDER KNOW ABOUT:   · Any allergies you have.  · All medicines you are taking, including vitamins, herbs, eye drops, creams, and over-the-counter medicines.  · Previous problems you or members of your family have had with the use of anesthetics.    · Any blood disorders you have.    · Previous surgeries you have had.    · Medical conditions you have.  RISKS AND COMPLICATIONS   Generally, this is a safe procedure. However, problems can occur and include:   · Breathing problems related to the anesthetic used.  · A blood clot that breaks free and travels to other parts of your body. This could cause a stroke or other problems. The risk of this is lowered by use of blood-thinning medicine (anticoagulant) prior to the procedure.  · Cardiac arrest (rare).  BEFORE THE PROCEDURE   · You may have tests to detect blood clots in your heart and to evaluate heart function.   · You may start taking anticoagulants so your blood does not clot as easily.    · Medicines may be given to help stabilize your heart rate and rhythm.  PROCEDURE  · You will be given medicine through an IV tube to reduce discomfort and make you sleepy (sedative).    · An electrical shock will be delivered.  AFTER THE PROCEDURE  Your heart rhythm will be watched to make sure it does not change.    Document Released: 01/31/2002 Document Revised: 06/27/2013 Document Reviewed: 08/25/2012  ExitCare® Patient Information ©2015 [employer]. This information is not intended to replace advice given to you by your health care provider. Make sure you discuss any questions you have with your health care provider.

## 2014-10-29 NOTE — H&P (Signed)
OFFICE VISIT NOTES COPIED TO EPIC FOR DOCUMENTATION   Penny Ramos 2014/11/25 8:38 AM Location: [facility] Patient #: [phone] DOB: [birth year] Widowed / Language: Penny Ramos / Race: White Female  History of Present Illness Penny Page MD; 11/25/14 6:18 PM) Patient words: NP EVAL for atrial fibrillation. Patient was seen in the hospital on 10/21/2014.  The patient is a 81 year old female, accompanied by her son during the visit, who presents with atrial fibrillation. She has h/o hypertension, hyperlipidemia and PAF admitted on 10/09/2014 with A. Fib with RVR, but converted to sinus on medical therapy. Discharged home with OP work-up. She has undergone echocardiogram on 10/17/2014 which essentially revealed preserved ejection fraction of the left ventricle, mild mitral regurgitation and mild to moderate tricuspid regurgitation and moderate LV hypertension. Nuclear stress test on 10/13/2014 revealed no evidence of ischemia.  Readmitted on 10/21/2014 with recurrent symptoms and A. Fib with RVR. She was started on flecainide with improvement in rate to 80-90, she now presents here for follow-up. Except for marked fatigue she denies any other symptoms, she states that she is very nervous and anxious. She is accompanied by her son at the bedside. She was also diagnosed with new onset of diabetes mellitus. She is tolerating all the medications well, no GI bleeding, no history of fall, no leg edema, complains of mild dyspnea which is remained stable without any PND or orthopnea.  Problem List/Past Medical Penny Ramos; 2014/11/25 9:08 AM) Atrial fibrillation (I48.91) Essential hypertension, benign (I10) Hyperlipidemia (E78.5) Anxiety (F41.9)  Allergies (Penny Ramos; 11-25-14 9:14 AM) NSAIDs Nausea. Strawberry  Family History Penny Ramos; 25-Nov-2014 9:17 AM) Mother Deceased. at age 42 from natural causes; no heart attacks or strokes; HTN, but no other  cardiovascular conditions Father Deceased. at age 71 from an unknown heart condition; no heart attacks or strokes; patient is unsure of the exact cardiovascular conditions of her father Sister 1 In stable health. 4 yrs younger; HTN but no other cardiovascular conditions Brother 1 16 yrs younger; no cardiovascular conditions  Social History Penny Ramos; Nov 25, 2014 9:20 AM) Living Situation Lives alone. Number of Children 2. Current tobacco use Former smoker. quit in 1990; Non Drinker/No Alcohol Use Marital status Widowed.  Past Surgical History Penny Ramos; 2014-11-25 9:22 AM) Ankle Fracture [id number] Back [phone] Appendectomy1950 Cholecystectomy1965 Hysterectomy; [id number] non cancer related  Medication History (Penny Ramos; 11-25-14 9:31 AM) Atorvastatin Calcium (10MG Tablet, 1 Oral daily) Active. Clorazepate Dipotassium (15MG Tablet, 1 Oral four times daily as needed) Active. DiltiaZEM HCl ER Coated Beads (120MG Capsule ER 24HR, 1 Oral daily) Active. Flecainide Acetate (100MG Tablet, 1 Oral two times daily) Active. Lisinopril-Hydrochlorothiazide (20-25MG Tablet, 1 Oral daily) Active. Metoprolol Succinate ER (25MG Tablet ER 24HR, 1 Oral daily) Active. Xarelto (15MG Tablet, 1 Oral every evening after dinner) Active. Acetaminophen (500MG Tablet, 1 to 2 Oral as needed for pain) Active. Medications Reconciled  Diagnostic Studies History Penny Ramos; 11-25-14 9:25 AM) Colonoscopy2011 Nuclear stress test08/19/2016 1. The resting electrocardiogram demonstrated normal sinus rhythm, normal resting conduction, no resting arrhythmias and ST depression 1 mm with T inversion in inferior and lateral leads. PAC. Stress EKG is non-diagnostic for ischemia as it a pharmacologic stress using Lexiscan, with no additional ST changes, Stress symptoms included dyspnea. 2. The perfusion imaging study demonstrates breast attenuation artifact in the inferior  wall. There is no demonstrable ischemia or scar. Normal wall motion and LV systolic function was normal at 67%. This is a low risk study. Echocardiogram08/23/2016 1. Left ventricle cavity  is normal in size. Mild concentric hypertrophy of the left ventricle. Mild decrease in global wall motion. Calculated EF 50%. 2. Left atrial cavity is moderately dilated. 3. Right atrial cavity is mildly dilated. 4. Mild calcification of the aortic valve annulus. Mild aortic valve leaflet thickening. 5. Mild mitral regurgitation. Moderate calcification of the mitral valve annulus. 6. Mild to moderate tricuspid regurgitation. Moderate pulmonary hypertension with approx. PA syst. pressure of 48 mm of Hg. 7. IVC is dilated with respiratory variation.   Review of Systems Penny Page MD; 10/26/2014 6:18 PM) General Present- Fatigue and Feeling well. Not Present- Fever and Night Sweats. Skin Not Present- Itching and Rash. HEENT Not Present- Headache. Respiratory Present- Difficulty Breathing. Cardiovascular Not Present- Claudications, Fainting, Orthopnea and Swelling of Extremities. Gastrointestinal Not Present- Abdominal Pain, Constipation, Diarrhea, Nausea and Vomiting. Musculoskeletal Not Present- Joint Swelling. Neurological Not Present- Headaches. Hematology Not Present- Blood Clots, Easy Bruising and Nose Bleed. Vitals Penny Ramos; 10/26/2014 9:35 AM) 10/26/2014 9:31 AM Weight: 145 lb Height: 63in Body Surface Area: 1.69 m Body Mass Index: 25.69 kg/m  Pulse: 117 (Regular)  P.OX: 94% (Room air) BP: 116/74 (Sitting, Left Arm, Standard)     Physical Exam Penny Page MD; 10/26/2014 6:19 PM) General Mental Status-Alert. General Appearance-Cooperative, Appears stated age, Not in acute distress. Orientation-Oriented X3. Build & Nutrition-Well built.  Head and Neck Thyroid Gland Characteristics - no palpable nodules, no palpable enlargement.  Chest and Lung Exam Chest  and lung exam reveals -normal excursion with symmetric chest walls, quiet, even and easy respiratory effort with no use of accessory muscles and on auscultation, normal breath sounds, no adventitious sounds and normal vocal resonance. Palpation Tender - No chest wall tenderness.  Cardiovascular Cardiovascular examination reveals -carotid auscultation reveals no bruits, abdominal aorta auscultation reveals no bruits, femoral artery auscultation bilaterally reveals normal pulses, no bruits, no thrills, normal pedal pulses bilaterally and no digital clubbing, cyanosis, edema, increased warmth or tenderness. Inspection Jugular vein - Right - No Distention. Auscultation Heart Sounds - S1 WNL, S2 WNL and No gallop present. Murmurs & Other Heart Sounds: Murmur - Location - Apex. Timing - Early systolic. Grade - I/VI.  Abdomen Palpation/Percussion Palpation and Percussion of the abdomen reveal - Non Tender and No hepatosplenomegaly. Auscultation Auscultation of the abdomen reveals - Bowel sounds normal.  Neurologic Motor-Grossly intact without any focal deficits.  Musculoskeletal - Did not examine.  Results Penny Page MD; 10/26/2014 6:20 PM) Labs Name Value Range Date METABOLIC PANEL, BASIC (17793)   Collected: 10/26/2014 10:44 AM Glucose, Serum 104 mg/dL 65-99 mg/dL Collected: 10/26/2014 10:44 AM BUN 61 mg/dL 8-27 mg/dL Collected: 10/26/2014 10:44 AM Creatinine, Serum 1.54 mg/dL 0.57-1.00 mg/dL Collected: 10/26/2014 10:44 AM eGFR If NonAfricn Am 31 mL/min/1.73 >59 mL/min/1.73 Collected: 10/26/2014 10:44 AM eGFR If Africn Am 35 mL/min/1.73 >59 mL/min/1.73 Collected: 10/26/2014 10:44 AM BUN/Creatinine Ratio 40 11-26 Collected: 10/26/2014 10:44 AM Sodium, Serum 136 mmol/L 134-144 mmol/L Collected: 10/26/2014 10:44 AM Potassium, Serum 4.6 mmol/L 3.5-5.2 mmol/L Collected: 10/26/2014 10:44 AM Chloride, Serum 101  mmol/L 96-108 mmol/L Collected: 10/26/2014 10:44 AM Carbon Dioxide, Total 32 mmol/L 18-29 mmol/L Collected: 10/26/2014 10:44 AM Calcium, Serum 10.2 mg/dL 8.7-10.3 mg/dL Collected: 10/26/2014 10:44 AM   Assessment & Plan Penny Page MD; 10/26/2014 6:21 PM) Paroxysmal atrial fibrillation (I48.0) Story: CHA2DS2-VASCScore: Risk Score 4, Yearly risk of stroke 4%. Clever Has Bled: Score 2. Estimated risk of major bleeding at 1 year with [place] 1.8-3.2%  Lexiscan myoview stress test 10/13/2014: 1. The resting  electrocardiogram demonstrated normal sinus rhythm, normal resting conduction, no resting arrhythmias and ST depression 1 mm with T inversion in inferior and lateral leads. PAC. Stress EKG is non-diagnostic for ischemia as it a pharmacologic stress using Lexiscan, with no additional ST changes, Stress symptoms included dyspnea. 2. The perfusion imaging study demonstrates breast attenuation artifact in the inferior wall. There is no demonstrable ischemia or scar. Normal wall motion and LV systolic function was normal at 67%. This is a low risk study.  Echo- 10/17/2014 1. Left ventricle cavity is normal in size. Mild concentric hypertrophy of the left ventricle. Mild decrease in global wall motion. Calculated EF 50%. 2. Left atrial cavity is moderately dilated. 3. Right atrial cavity is mildly dilated. 4. Mild calcification of the aortic valve annulus. Mild aortic valve leaflet thickening. 5. Mild mitral regurgitation. Moderate calcification of the mitral valve annulus. 6. Mild to moderate tricuspid regurgitation. Moderate pulmonary hypertension with approx. PA syst. pressure of 48 mm of Hg. 7. IVC is dilated with respiratory variation. Current Plans Complete electrocardiogram (70488) METABOLIC PANEL, BASIC (89169) Hyperlipidemia, mild (E78.5) Essential hypertension, benign (I10) CKD (chronic kidney disease), stage 3 (moderate) (N18.3) Story: Labs 10/26/2014: BUN 61, creatinine  1.54, eGFR 31, potassium 4.6  Chronic renal failure, stage III, eGFR 48 mL Diabetes mellitus, new onset (E11.9) Story: HbA1c of 6.7% on 10/10/2014 Current Plans Mechanism of underlying disease process and action of medications discussed with the patient. I discussed primary/secondary prevention and also dietary counceling was done. Patient presents to be in atrial fibrillation, presently in atrial flutter with variable ventricular response. She would benefit from proceeding with direct current cardioversion due to marked fatigue. Her stress test and echocardiogram were reviewed, overall low risk. Otherwise blood pressure is well controlled and she'll continue present medications including anticoagulation. I will see her back after the cardioversion. All questions answered. She will follow-up with Dr. Nyoka Cowden for evaluation of hyperglycemia/new onset diabetes and they're also requesting change in antianxiety medication.  Schedule for Direct current cardioversion. I have discussed regarding risks benefits rate control vs rhythm control with the patient. Patient understands cardiac arrest and need for CPR, aspiration pneumonia, but not limited to these. Patient is willing.    Signed by Penny Page, MD (10/26/2014 6:21 PM)

## 2014-10-31 ENCOUNTER — Ambulatory Visit (HOSPITAL_COMMUNITY): Payer: Medicare Other | Admitting: Anesthesiology

## 2014-10-31 ENCOUNTER — Ambulatory Visit (HOSPITAL_COMMUNITY)
Admission: RE | Admit: 2014-10-31 | Discharge: 2014-10-31 | Disposition: A | Discharge status: Home / Self Care | Payer: Medicare Other | Source: Ambulatory Visit | Attending: Cardiology | Admitting: Cardiology

## 2014-10-31 ENCOUNTER — Encounter (HOSPITAL_COMMUNITY)
Admission: RE | Disposition: A | Discharge status: Home / Self Care | Payer: Self-pay | Source: Ambulatory Visit | Attending: Cardiology

## 2014-10-31 ENCOUNTER — Encounter (HOSPITAL_COMMUNITY): Payer: Self-pay | Admitting: Anesthesiology

## 2014-10-31 DIAGNOSIS — N183 Chronic kidney disease, stage 3 (moderate): Secondary | ICD-10-CM | POA: Diagnosis not present

## 2014-10-31 DIAGNOSIS — I129 Hypertensive chronic kidney disease with stage 1 through stage 4 chronic kidney disease, or unspecified chronic kidney disease: Secondary | ICD-10-CM | POA: Insufficient documentation

## 2014-10-31 DIAGNOSIS — E1122 Type 2 diabetes mellitus with diabetic chronic kidney disease: Secondary | ICD-10-CM | POA: Insufficient documentation

## 2014-10-31 DIAGNOSIS — I480 Paroxysmal atrial fibrillation: Secondary | ICD-10-CM | POA: Diagnosis not present

## 2014-10-31 HISTORY — PX: CARDIOVERSION: SHX1299

## 2014-10-31 SURGERY — CARDIOVERSION
Anesthesia: Monitor Anesthesia Care

## 2014-10-31 MED ORDER — PHENYLEPHRINE HCL 10 MG/ML IJ SOLN
INTRAMUSCULAR | Status: DC | PRN
  Administered 2014-10-31: 80 ug via INTRAVENOUS
  Administered 2014-10-31: 40 ug via INTRAVENOUS
  Administered 2014-10-31: 120 ug via INTRAVENOUS

## 2014-10-31 MED ORDER — SODIUM CHLORIDE 0.9 % IV SOLN
INTRAVENOUS | Status: DC
  Administered 2014-10-31 (×2): via INTRAVENOUS

## 2014-10-31 MED ORDER — PROPOFOL 10 MG/ML IV BOLUS
INTRAVENOUS | Status: DC | PRN
Start: 2014-10-31 — End: 2014-10-31
  Administered 2014-10-31: 80 mg via INTRAVENOUS

## 2014-10-31 MED ORDER — EPHEDRINE SULFATE 50 MG/ML IJ SOLN
INTRAMUSCULAR | Status: DC | PRN
  Administered 2014-10-31 (×2): 10 mg via INTRAVENOUS

## 2014-10-31 MED ORDER — LIDOCAINE HCL (CARDIAC) 20 MG/ML IV SOLN
INTRAVENOUS | Status: DC | PRN
  Administered 2014-10-31: 40 mg via INTRAVENOUS

## 2014-10-31 NOTE — Transfer of Care (Signed)
Immediate Anesthesia Transfer of Care Note  Patient: [name]  Procedure(s) Performed: Procedure(s): CARDIOVERSION (N/A)  Patient Location: PACU and Endoscopy Unit  Anesthesia Type:MAC  Level of Consciousness: awake, alert , oriented and patient cooperative  Airway & Oxygen Therapy: Patient Spontanous Breathing and Patient connected to nasal cannula oxygen  Post-op Assessment: Report given to RN, Post -op Vital signs reviewed and stable and Patient moving all extremities  Post vital signs: Reviewed and stable  Last Vitals:  Filed Vitals:   10/31/14 1235  BP: 88/40  Pulse: 70  Temp: 36.4 C  Resp: 20    Complications: No apparent anesthesia complications

## 2014-10-31 NOTE — Discharge Instructions (Signed)

## 2014-10-31 NOTE — Interval H&P Note (Signed)
History and Physical Interval Note:  10/31/2014 11:30 AM  Penny Ramos  has presented today for surgery, with the diagnosis of AFIB  The various methods of treatment have been discussed with the patient and family. After consideration of risks, benefits and other options for treatment, the patient has consented to  Procedure(s): CARDIOVERSION (N/A) as a surgical intervention .  The patient's history has been reviewed, patient examined, no change in status, stable for surgery.  I have reviewed the patient's chart and labs.  Questions were answered to the patient's satisfaction.     [name]

## 2014-10-31 NOTE — Anesthesia Postprocedure Evaluation (Signed)
  Anesthesia Post-op Note  Patient: Penny Ramos  Procedure(s) Performed: Procedure(s): CARDIOVERSION (N/A)  Patient Location: PACU and Endoscopy Unit  Anesthesia Type:MAC  Level of Consciousness: awake, alert , oriented and patient cooperative  Airway and Oxygen Therapy: Patient Spontanous Breathing and Patient connected to nasal cannula oxygen  Post-op Pain: none  Post-op Assessment: Post-op Vital signs reviewed, Patient's Cardiovascular Status Stable, Respiratory Function Stable, Patent Airway and No signs of Nausea or vomiting              Post-op Vital Signs: Reviewed and stable  Last Vitals:  Filed Vitals:   10/31/14 1235  BP: 88/40  Pulse: 70  Temp: 36.4 C  Resp: 20    Complications: No apparent anesthesia complications

## 2014-10-31 NOTE — Anesthesia Preprocedure Evaluation (Addendum)
Anesthesia Evaluation  Patient identified by MRN, date of birth, ID band Patient awake    Reviewed: Allergy & Precautions, NPO status , Patient's Chart, lab work & pertinent test results, reviewed documented beta blocker date and time   Airway Mallampati: I  TM Distance: >3 FB Neck ROM: Full    Dental  (+) Teeth Intact, Missing, Dental Advisory Given   Pulmonary former smoker,  breath sounds clear to auscultation        Cardiovascular hypertension, Pt. on medications Rhythm:Irregular Rate:Normal     Neuro/Psych    GI/Hepatic negative GI ROS, Neg liver ROS,   Endo/Other  negative endocrine ROS  Renal/GU negative Renal ROSCRI     Musculoskeletal   Abdominal   Peds  Hematology   Anesthesia Other Findings   Reproductive/Obstetrics                            Anesthesia Physical Anesthesia Plan  ASA: III  Anesthesia Plan: MAC   Post-op Pain Management:    Induction: Intravenous  Airway Management Planned: Mask  Additional Equipment:   Intra-op Plan:   Post-operative Plan:   Informed Consent:   Dental advisory given  Plan Discussed with: CRNA and Anesthesiologist  Anesthesia Plan Comments:         Anesthesia Quick Evaluation

## 2014-10-31 NOTE — CV Procedure (Signed)
Direct current cardioversion:  Indication symptomatic A. Fibrillation.  Procedure: Using 60 mg of IV Propofol and 40 IV Lidocaine (for reducing venous pain) for achieving deep sedation, synchronized direct current cardioversion performed. Patient was delivered with 75 Joules of electricity X 1 with success to NSR. Patient tolerated the procedure well. No immediate complication noted.

## 2014-11-01 ENCOUNTER — Encounter (HOSPITAL_COMMUNITY): Payer: Self-pay | Admitting: Cardiology

## 2014-11-02 ENCOUNTER — Encounter (HOSPITAL_COMMUNITY): Payer: Self-pay | Admitting: Cardiology

## 2014-12-19 NOTE — Discharge Summary (Signed)
Physician Discharge Summary  Patient ID: Penny Ramos MRN: [id number] DOB/AGE: 79/03/1927 79 y.o.  Admit date: 10/21/2014 Discharge date: 10/23/2014  Primary Discharge Diagnosis Paroxysmal A. Fib with RVR   Secondary Discharge Diagnosis Hypertension Hyperlipidemia  Hospital Course: Penny Ramos  is a 81 y.o. female  With h/o hypertension, hyperlipidemia and PAF admitted on 10/09/2014 with A. Fib with RVR, but converted to sinus on medical therapy. Discharged home with OP work-up. Readmitted on 10/21/2014 with recurrent symptoms and A. Fib. She felt extremely anxious and presented to the ED. she was found to be in atrial fibrillation with rapid ventricular response.  I started her on flecainide and observed for 48 hours.  She persisted in atrial fibrillation, was already on anticoagulation, hence felt stable for discharge.  Plan was for out patient cardioversion.  Recommendations on discharge: patient will be followed up in the out-patient basis, she remains in atrial fibrillation consider electrocardioversion.  Discharge Exam: Blood pressure 110/65, pulse 59, temperature 97.5 F (36.4 C), temperature source Oral, resp. rate 18, height 5\' 3"  (1.6 m), weight 69.31 kg (152 lb 12.8 oz), SpO2 98 %.   General appearance: alert, cooperative, appears stated age, no distress and mildly obese Eyes: negative findings: lids and lashes normal Neck: no adenopathy, no carotid bruit, no JVD, supple, symmetrical, trachea midline and thyroid not enlarged, symmetric, no tenderness/mass/nodules Neck: JVP - normal, carotids 2+= without bruits Resp: there is coarse crackles at both the lung bases. Chest wall: no tenderness Cardio: irregular rate and rhythm, S1 variable, S2 normal, no murmur, click, rub or gallop GI: soft, non-tender; bowel sounds normal; no masses, no organomegaly and Obese Extremities: extremities normal, atraumatic, no cyanosis or edema Pulses: 2+ and symmetric Skin: Skin color,  texture, turgor normal. No rashes or lesions Neurologic: Grossly normal  Labs:   Lab Results  Component Value Date   WBC 8.3 10/21/2014   HGB 11.2* 10/21/2014   HCT 34.7* 10/21/2014   MCV 92.5 10/21/2014   PLT 433* 10/21/2014   No results for input(s): NA, K, CL, CO2, BUN, CREATININE, CALCIUM, PROT, BILITOT, ALKPHOS, ALT, AST, GLUCOSE in the last 168 hours.  Invalid input(s): LABALBU  Lipid Panel  No results found for: CHOL, TRIG, HDL, CHOLHDL, VLDL, LDLCALC  BNP (last 3 results)  Recent Labs  10/09/14 0803  BNP 332.9*    ProBNP (last 3 results) No results for input(s): PROBNP in the last 8760 hours.  HEMOGLOBIN A1C Lab Results  Component Value Date   HGBA1C 6.7* 10/10/2014   MPG 146 10/10/2014    Cardiac Panel (last 3 results)  Recent Labs  10/09/14 1541 10/09/14 2004 10/10/14 0140  TROPONINI 0.04* 0.04* 0.04*    Lab Results  Component Value Date   TROPONINI 0.04* 10/10/2014     TSH  Recent Labs  10/09/14 0803  TSH 1.191    EKG: 10/21/2014: A. Fib with RVR with non specific ST-T abnormalities. Normal QT.  Out-patient work-up: Echo- 10/17/2014 1. Left ventricle cavity is normal in size. Mild concentric hypertrophy of the left ventricle. Mild decrease in global wall motion. Calculated EF 50%. 2. Left atrial cavity is moderately dilated. 3. Right atrial cavity is mildly dilated. 4. Mild calcification of the aortic valve annulus. Mild aortic valve leaflet thickening. 5. Mild mitral regurgitation. Moderate calcification of the mitral valve annulus. 6. Mild to moderate tricuspid regurgitation. Moderate pulmonary hypertension with approx. PA syst. Pressure of 48 mm of Hg. 7. IVC is dilated with respiratory variation.  Lexiscan myoview stress  test 10/13/2014: 1. The resting electrocardiogram demonstrated normal sinus rhythm, normal resting conduction, no resting arrhythmias and ST depression 1 mm with T inversion in inferior and lateral leads. PAC.  Stress EKG is non-diagnostic for ischemia as it a pharmacologic stress using Lexiscan, with no additional ST changes, Stress symptoms included dyspnea. 2. The perfusion imaging study demonstrates breast attenuation artifact in the inferior wall. There is no demonstrable ischemia or scar. Normal wall motion and LV systolic function was normal at 67%. This is a low risk study. FOLLOW UP PLANS AND APPOINTMENTS Discharge Instructions    Discharge patient    Complete by:  As directed             Medication List    TAKE these medications        acetaminophen 500 MG tablet  Commonly known as:  TYLENOL  Take 500-1,000 mg by mouth every 6 (six) hours as needed for mild pain, moderate pain, fever or headache.     atorvastatin 10 MG tablet  Commonly known as:  LIPITOR  Take 10 mg by mouth daily.     CHLORASEPTIC 1.4 % Liqd  Generic drug:  phenol  Use as directed 1 spray in the mouth or throat as needed for throat irritation / pain.     clorazepate 7.5 MG tablet  Commonly known as:  TRANXENE  Take 7.5 mg by mouth 4 (four) times daily.     diltiazem 120 MG 24 hr capsule  Commonly known as:  CARDIZEM CD  Take 1 capsule (120 mg total) by mouth daily.     flecainide 100 MG tablet  Commonly known as:  TAMBOCOR  Take 1 tablet (100 mg total) by mouth 2 (two) times daily.     lactose free nutrition Liqd  Take 237 mLs by mouth daily.     lisinopril-hydrochlorothiazide 20-25 MG tablet  Commonly known as:  PRINZIDE,ZESTORETIC  Take 1 tablet by mouth daily.     metoprolol succinate 25 MG 24 hr tablet  Commonly known as:  TOPROL-XL  Take 1 tablet (25 mg total) by mouth daily.     Rivaroxaban 15 MG Tabs tablet  Commonly known as:  XARELTO  Take 1 tablet (15 mg total) by mouth daily with supper.           Follow-up Information    Follow up with [name], MD On 10/26/2014.   Specialty:  Cardiology   Why:  at 9:00am   Contact information:   [address] [place]  [address] [phone]        [name], MD 12/19/2014, 9:10 PM  Pager: [phone] Office: [phone] If no answer: [phone]

## 2014-12-23 ENCOUNTER — Emergency Department (HOSPITAL_COMMUNITY): Payer: Medicare Other

## 2014-12-23 ENCOUNTER — Encounter (HOSPITAL_COMMUNITY): Payer: Self-pay | Admitting: Emergency Medicine

## 2014-12-23 ENCOUNTER — Inpatient Hospital Stay (HOSPITAL_COMMUNITY)
Admission: EM | Admit: 2014-12-23 | Discharge: 2015-01-01 | DRG: 871 | Disposition: A | Discharge status: Skilled Nursing Facility | Payer: Medicare Other | Attending: Internal Medicine | Admitting: Internal Medicine

## 2014-12-23 DIAGNOSIS — A049 Bacterial intestinal infection, unspecified: Secondary | ICD-10-CM | POA: Diagnosis present

## 2014-12-23 DIAGNOSIS — Z91018 Allergy to other foods: Secondary | ICD-10-CM | POA: Diagnosis not present

## 2014-12-23 DIAGNOSIS — K859 Acute pancreatitis without necrosis or infection, unspecified: Secondary | ICD-10-CM | POA: Diagnosis present

## 2014-12-23 DIAGNOSIS — K922 Gastrointestinal hemorrhage, unspecified: Secondary | ICD-10-CM | POA: Diagnosis not present

## 2014-12-23 DIAGNOSIS — K5900 Constipation, unspecified: Secondary | ICD-10-CM | POA: Diagnosis not present

## 2014-12-23 DIAGNOSIS — R7881 Bacteremia: Secondary | ICD-10-CM | POA: Diagnosis not present

## 2014-12-23 DIAGNOSIS — E872 Acidosis, unspecified: Secondary | ICD-10-CM

## 2014-12-23 DIAGNOSIS — E119 Type 2 diabetes mellitus without complications: Secondary | ICD-10-CM | POA: Diagnosis present

## 2014-12-23 DIAGNOSIS — I493 Ventricular premature depolarization: Secondary | ICD-10-CM | POA: Diagnosis not present

## 2014-12-23 DIAGNOSIS — I480 Paroxysmal atrial fibrillation: Secondary | ICD-10-CM | POA: Diagnosis present

## 2014-12-23 DIAGNOSIS — I10 Essential (primary) hypertension: Secondary | ICD-10-CM

## 2014-12-23 DIAGNOSIS — Z79899 Other long term (current) drug therapy: Secondary | ICD-10-CM | POA: Diagnosis not present

## 2014-12-23 DIAGNOSIS — K858 Other acute pancreatitis without necrosis or infection: Secondary | ICD-10-CM | POA: Diagnosis present

## 2014-12-23 DIAGNOSIS — A4159 Other Gram-negative sepsis: Principal | ICD-10-CM | POA: Diagnosis present

## 2014-12-23 DIAGNOSIS — R1030 Lower abdominal pain, unspecified: Secondary | ICD-10-CM

## 2014-12-23 DIAGNOSIS — Z886 Allergy status to analgesic agent status: Secondary | ICD-10-CM

## 2014-12-23 DIAGNOSIS — Z7901 Long term (current) use of anticoagulants: Secondary | ICD-10-CM

## 2014-12-23 DIAGNOSIS — E7800 Pure hypercholesterolemia, unspecified: Secondary | ICD-10-CM | POA: Diagnosis present

## 2014-12-23 DIAGNOSIS — I482 Chronic atrial fibrillation, unspecified: Secondary | ICD-10-CM | POA: Diagnosis present

## 2014-12-23 DIAGNOSIS — Z87891 Personal history of nicotine dependence: Secondary | ICD-10-CM

## 2014-12-23 DIAGNOSIS — E785 Hyperlipidemia, unspecified: Secondary | ICD-10-CM

## 2014-12-23 DIAGNOSIS — A419 Sepsis, unspecified organism: Secondary | ICD-10-CM | POA: Diagnosis not present

## 2014-12-23 DIAGNOSIS — D62 Acute posthemorrhagic anemia: Secondary | ICD-10-CM | POA: Diagnosis present

## 2014-12-23 DIAGNOSIS — E876 Hypokalemia: Secondary | ICD-10-CM | POA: Diagnosis present

## 2014-12-23 DIAGNOSIS — R197 Diarrhea, unspecified: Secondary | ICD-10-CM

## 2014-12-23 DIAGNOSIS — Z66 Do not resuscitate: Secondary | ICD-10-CM | POA: Diagnosis present

## 2014-12-23 DIAGNOSIS — F419 Anxiety disorder, unspecified: Secondary | ICD-10-CM | POA: Diagnosis present

## 2014-12-23 DIAGNOSIS — E860 Dehydration: Secondary | ICD-10-CM | POA: Diagnosis present

## 2014-12-23 DIAGNOSIS — B952 Enterococcus as the cause of diseases classified elsewhere: Secondary | ICD-10-CM | POA: Diagnosis not present

## 2014-12-23 DIAGNOSIS — K529 Noninfective gastroenteritis and colitis, unspecified: Secondary | ICD-10-CM | POA: Diagnosis not present

## 2014-12-23 DIAGNOSIS — K5909 Other constipation: Secondary | ICD-10-CM | POA: Diagnosis not present

## 2014-12-23 DIAGNOSIS — N179 Acute kidney failure, unspecified: Secondary | ICD-10-CM | POA: Diagnosis present

## 2014-12-23 DIAGNOSIS — A09 Infectious gastroenteritis and colitis, unspecified: Secondary | ICD-10-CM | POA: Diagnosis present

## 2014-12-23 DIAGNOSIS — I4891 Unspecified atrial fibrillation: Secondary | ICD-10-CM | POA: Diagnosis present

## 2014-12-23 HISTORY — DX: Unspecified atrial fibrillation: I48.91

## 2014-12-23 LAB — CBC WITH DIFFERENTIAL/PLATELET
BASOS ABS: 0 10*3/uL (ref 0.0–0.1)
BASOS PCT: 0 %
Eosinophils Absolute: 0.1 10*3/uL (ref 0.0–0.7)
Eosinophils Relative: 0 %
HEMATOCRIT: 36.9 % (ref 36.0–46.0)
HEMOGLOBIN: 11.1 g/dL — AB (ref 12.0–15.0)
Lymphocytes Relative: 12 %
Lymphs Abs: 2.7 10*3/uL (ref 0.7–4.0)
MCH: 27.3 pg (ref 26.0–34.0)
MCHC: 30.1 g/dL (ref 30.0–36.0)
MCV: 90.9 fL (ref 78.0–100.0)
Monocytes Absolute: 0.4 10*3/uL (ref 0.1–1.0)
Monocytes Relative: 2 %
NEUTROS ABS: 19.1 10*3/uL — AB (ref 1.7–7.7)
NEUTROS PCT: 86 %
Platelets: 633 10*3/uL — ABNORMAL HIGH (ref 150–400)
RBC: 4.06 MIL/uL (ref 3.87–5.11)
RDW: 12.6 % (ref 11.5–15.5)
WBC: 22.3 10*3/uL — ABNORMAL HIGH (ref 4.0–10.5)

## 2014-12-23 LAB — I-STAT CG4 LACTIC ACID, ED
LACTIC ACID, VENOUS: 8.54 mmol/L — AB (ref 0.5–2.0)
LACTIC ACID, VENOUS: 2.07 mmol/L — AB (ref 0.5–2.0)
Lactic Acid, Venous: 5.97 mmol/L (ref 0.5–2.0)

## 2014-12-23 LAB — HEMOGLOBIN AND HEMATOCRIT, BLOOD
HCT: 33.1 % — ABNORMAL LOW (ref 36.0–46.0)
Hemoglobin: 10.1 g/dL — ABNORMAL LOW (ref 12.0–15.0)

## 2014-12-23 LAB — COMPREHENSIVE METABOLIC PANEL
ALBUMIN: 3.7 g/dL (ref 3.5–5.0)
ALT: 17 U/L (ref 14–54)
AST: 60 U/L — AB (ref 15–41)
Alkaline Phosphatase: 388 U/L — ABNORMAL HIGH (ref 38–126)
Anion gap: 16 — ABNORMAL HIGH (ref 5–15)
BILIRUBIN TOTAL: 0.4 mg/dL (ref 0.3–1.2)
BUN: 49 mg/dL — AB (ref 6–20)
CO2: 17 mmol/L — AB (ref 22–32)
Calcium: 10.4 mg/dL — ABNORMAL HIGH (ref 8.9–10.3)
Chloride: 104 mmol/L (ref 101–111)
Creatinine, Ser: 1.72 mg/dL — ABNORMAL HIGH (ref 0.44–1.00)
GFR calc Af Amer: 30 mL/min — ABNORMAL LOW (ref >60)
GFR calc non Af Amer: 26 mL/min — ABNORMAL LOW (ref >60)
GLUCOSE: 161 mg/dL — AB (ref 65–99)
POTASSIUM: 4.4 mmol/L (ref 3.5–5.1)
Sodium: 137 mmol/L (ref 135–145)
TOTAL PROTEIN: 7.3 g/dL (ref 6.5–8.1)

## 2014-12-23 LAB — C DIFFICILE QUICK SCREEN W PCR REFLEX
C DIFFICILE (CDIFF) INTERP: NEGATIVE
C DIFFICILE (CDIFF) TOXIN: NEGATIVE
C Diff antigen: NEGATIVE

## 2014-12-23 LAB — POC OCCULT BLOOD, ED: FECAL OCCULT BLD: POSITIVE — AB

## 2014-12-23 LAB — PROTIME-INR
INR: 1.63 — AB (ref 0.00–1.49)
PROTHROMBIN TIME: 19.4 s — AB (ref 11.6–15.2)

## 2014-12-23 LAB — ABO/RH: ABO/RH(D): O POS

## 2014-12-23 MED ORDER — ACETAMINOPHEN 650 MG RE SUPP: 650.0000 mg | Freq: Four times a day (QID) | RECTAL | Status: DC | PRN

## 2014-12-23 MED ORDER — SODIUM CHLORIDE 0.9 % IV SOLN
INTRAVENOUS | Status: DC
  Administered 2014-12-24 – 2014-12-25 (×4): via INTRAVENOUS

## 2014-12-23 MED ORDER — DILTIAZEM HCL ER COATED BEADS 120 MG PO CP24
120.0000 mg | ORAL_CAPSULE | Freq: Every day | ORAL | Status: DC
  Filled 2014-12-23: qty 1

## 2014-12-23 MED ORDER — SODIUM CHLORIDE 0.9 % IV BOLUS (SEPSIS)
1000.0000 mL | Freq: Once | INTRAVENOUS | Status: AC
  Administered 2014-12-23: 1000 mL via INTRAVENOUS

## 2014-12-23 MED ORDER — METRONIDAZOLE IN NACL 5-0.79 MG/ML-% IV SOLN
500.0000 mg | Freq: Once | INTRAVENOUS | Status: AC
  Administered 2014-12-23: 500 mg via INTRAVENOUS
  Filled 2014-12-23: qty 100

## 2014-12-23 MED ORDER — ACETAMINOPHEN 325 MG PO TABS
650.0000 mg | ORAL_TABLET | Freq: Four times a day (QID) | ORAL | Status: DC | PRN
  Administered 2014-12-24 – 2015-01-01 (×12): 650 mg via ORAL
  Filled 2014-12-23 (×13): qty 2

## 2014-12-23 MED ORDER — ATORVASTATIN CALCIUM 10 MG PO TABS
10.0000 mg | ORAL_TABLET | Freq: Every day | ORAL | Status: DC
  Filled 2014-12-23: qty 1

## 2014-12-23 MED ORDER — CIPROFLOXACIN IN D5W 400 MG/200ML IV SOLN
400.0000 mg | Freq: Once | INTRAVENOUS | Status: AC
  Administered 2014-12-23: 400 mg via INTRAVENOUS
  Filled 2014-12-23: qty 200

## 2014-12-23 MED ORDER — CLORAZEPATE DIPOTASSIUM 7.5 MG PO TABS
7.5000 mg | ORAL_TABLET | Freq: Two times a day (BID) | ORAL | Status: DC
  Administered 2014-12-24 – 2015-01-01 (×19): 7.5 mg via ORAL
  Filled 2014-12-23 (×19): qty 2

## 2014-12-23 MED ORDER — SODIUM CHLORIDE 0.9 % IV SOLN
Freq: Once | INTRAVENOUS | Status: AC
  Administered 2014-12-23: 14:00:00 via INTRAVENOUS

## 2014-12-23 MED ORDER — OXYCODONE HCL 5 MG PO TABS
5.0000 mg | ORAL_TABLET | ORAL | Status: DC | PRN
  Administered 2014-12-28 (×2): 5 mg via ORAL
  Filled 2014-12-23 (×3): qty 1

## 2014-12-23 MED ORDER — FLECAINIDE ACETATE 100 MG PO TABS
100.0000 mg | ORAL_TABLET | Freq: Two times a day (BID) | ORAL | Status: DC
  Administered 2014-12-24 – 2014-12-25 (×4): 100 mg via ORAL
  Filled 2014-12-23 (×5): qty 1

## 2014-12-23 MED ORDER — ACETAMINOPHEN 325 MG PO TABS
650.0000 mg | ORAL_TABLET | Freq: Four times a day (QID) | ORAL | Status: AC | PRN
  Administered 2014-12-23: 650 mg via ORAL
  Filled 2014-12-23: qty 2

## 2014-12-23 NOTE — ED Notes (Signed)
Attempted report 

## 2014-12-23 NOTE — ED Notes (Signed)
Patient returned from CT

## 2014-12-23 NOTE — ED Notes (Signed)
Bilat lower abd pain starting at 0900; reports feeling constipated, no BM in 3 days. Feeling weak and tired, [facility]. Could not get BP to read, so called EMS. EMS reports BP 72/38 on arrival and pale; given 500cc NS, improved to 84/40. Arrives alert and oriented. States feels weak. Requesting bed pan arrival to try to have BM.

## 2014-12-23 NOTE — Consult Note (Signed)
Name: Penny PandySheila S Mittelstadt MRN: [id number] DOB: 09/29/1927    ADMISSION DATE:  12/23/2014 CONSULTATION DATE:  12/23/2014  REFERRING MD :  Dr. Joseph ArtWoods  CHIEF COMPLAINT:  Hypotension/diarrhea  HISTORY OF PRESENT ILLNESS:   43F with h/o Afib and HTN who presents with 2-3 days of diarrhea that was associated with b/l lower abdominal crampy pain.  She denies and abnormal eating habbits or undercooked food.  She states normally she has a BM ever 2-3 days but has had watery diarrhea and today felt weak and came to the hospital.  In the ED she was found to be hypotensive with elevated lactate requring 3 L of fluid.  She denies nausea, vomiting and recent antibiotics  PAST MEDICAL HISTORY :   has a past medical history of Hypertension; High cholesterol; and Atrial fibrillation (HCC).  has past surgical history that includes Cholecystectomy; Abdominal hysterectomy; Back surgery; Ankle surgery; Appendectomy; and Cardioversion (N/A, 10/31/2014). Prior to Admission medications   Medication Sig Start Date End Date Taking? Authorizing Provider  acetaminophen (TYLENOL) 500 MG tablet Take 500-1,000 mg by mouth every 6 (six) hours as needed for mild pain, moderate pain, fever or headache.    Yes Historical Provider, MD  atorvastatin (LIPITOR) 10 MG tablet Take 10 mg by mouth daily.   Yes Historical Provider, MD  clorazepate (TRANXENE) 7.5 MG tablet Take 7.5 mg by mouth 2 (two) times daily.  11/09/12  Yes Historical Provider, MD  diltiazem (CARDIZEM CD) 120 MG 24 hr capsule Take 1 capsule (120 mg total) by mouth daily. 10/23/14  Yes Marcy SalvoBridgette Allison, NP  flecainide (TAMBOCOR) 100 MG tablet Take 1 tablet (100 mg total) by mouth 2 (two) times daily. 10/23/14  Yes Marcy SalvoBridgette Allison, NP  lactose free nutrition (BOOST) LIQD Take 237 mLs by mouth daily.   Yes Historical Provider, MD  lisinopril-hydrochlorothiazide (PRINZIDE,ZESTORETIC) 20-25 MG per tablet Take 1 tablet by mouth daily.   Yes Historical Provider, MD    Rivaroxaban (XARELTO) 15 MG TABS tablet Take 1 tablet (15 mg total) by mouth daily with supper. 10/10/14  Yes Yates DecampJay Ganji, MD  phenol (CHLORASEPTIC) 1.4 % LIQD Use as directed 1 spray in the mouth or throat as needed for throat irritation / pain.    Historical Provider, MD   Allergies  Allergen Reactions  . Strawberry Extract Anaphylaxis  . Nsaids Nausea And Vomiting    FAMILY HISTORY:  family history is not on file. SOCIAL HISTORY:  reports that she has quit smoking. She does not have any smokeless tobacco history on file. She reports that she does not drink alcohol or use illicit drugs.  REVIEW OF SYSTEMS:   Constitutional: Negative for fever, weight loss, malaise/fatigue and diaphoresis. +chills HENT: Negative for hearing loss, ear pain, nosebleeds, congestion, sore throat, neck pain, tinnitus and ear discharge.   Eyes: Negative for blurred vision, double vision, photophobia, pain, discharge and redness.  Respiratory: Negative for cough, hemoptysis, sputum production, shortness of breath, wheezing and stridor.   Cardiovascular: Negative for chest pain, palpitations, orthopnea, claudication, leg swelling and PND.  Gastrointestinal: Negative for heartburn, nausea, vomiting, blood in stool and melena. +abdominal pain, diarrhea, Genitourinary: Negative for dysuria, urgency, frequency, hematuria and flank pain.  Musculoskeletal: Negative for myalgias, back pain, joint pain and falls.  Skin: Negative for itching and rash.  Neurological: Negative for dizziness, tingling, tremors, sensory change, speech change, focal weakness, seizures, loss of consciousness, weakness and headaches.  Endo/Heme/Allergies: Negative for environmental allergies and polydipsia. Does not bruise/bleed easily.  SUBJECTIVE:  VITAL SIGNS: Temp:  [96.9 F (36.1 C)-97.4 F (36.3 C)] 96.9 F (36.1 C) (10/29 1412) Pulse Rate:  [39-94] 94 (10/29 1945) Resp:  [14-26] 17 (10/29 1945) BP: (73-125)/(33-95) 123/74 mmHg  (10/29 1945) SpO2:  [94 %-99 %] 97 % (10/29 1945) Weight:  [65.318 kg (144 lb)] 65.318 kg (144 lb) (10/29 1232)  PHYSICAL EXAMINATION: General:  NAD Neuro:  AAOx3, CN II-XII intact HEENT:  ETT in place Cardiovascular:  Ireg rhythm, normal rate, no m/r/g Lungs:  CTA b/l no w/r/r Abdomen:  TTP b/l Lower quadrants.  Negative murphy sign, no rebound or gaurding. Musculoskeletal:  Normal bulk and tone    Recent Labs Lab 12/23/14 1312  NA 137  K 4.4  CL 104  CO2 17*  BUN 49*  CREATININE 1.72*  GLUCOSE 161*    Recent Labs Lab 12/23/14 1312 12/23/14 1548  HGB 11.1* 10.1*  HCT 36.9 33.1*  WBC 22.3*  --   PLT 633*  --    Ct Abdomen Pelvis Wo Contrast  12/23/2014  CLINICAL DATA:  Lower abdominal pain.  Constipation for 3 days EXAM: CT ABDOMEN AND PELVIS WITHOUT CONTRAST TECHNIQUE: Multidetector CT imaging of the abdomen and pelvis was performed following the standard protocol without IV contrast. COMPARISON:  None. FINDINGS: Lower chest: Lung bases are clear. Hepatobiliary: No focal hepatic lesion. Gallbladder is not identified. No biliary duct dilatation Pancreas: Pancreas is normal. No ductal dilatation. No pancreatic inflammation. Spleen: Normal spleen Adrenals/urinary tract: Adrenal glands and kidneys are normal. The ureters and bladder normal. Stomach/Bowel: Stomach is mildly distended with fluid. There is fluid in the duodenum. The proximal small bowel is normal caliber. The distal small bowel is likewise normal caliber. The cecum is fluid-filled. Appendix not identified. Transverse colon is normal caliber. There is fluid stool in the descending colon.several diverticula of the sigmoid colon without acute inflammation. Rectum is normal. Vascular/Lymphatic: Abdominal aorta is normal caliber with atherosclerotic calcification. There is no retroperitoneal or periportal lymphadenopathy. No pelvic lymphadenopathy. Reproductive: Post hysterectomy. Musculoskeletal: Severe degenerative  change of the lumbar spine without acute findings Other: No free fluid in the abdomen or pelvis. IMPRESSION: 1. Fluid stool throughout the colon suggests diarrheal disease. 2. No clear evidence of bowel obstruction. No oral contrast administered. 3. Severe atherosclerotic calcification of the aorta. 4. Severe degenerative changes lumbar spine. Electronically Signed   By: Genevive Bi M.D.   On: 12/23/2014 15:34    ASSESSMENT / PLAN:  68F with acute viral gastroenteritis vs diverticulitis.  CT a/p without contrast difficult to say however, if WBC does not down-trend or pt develops worsening fevers/chills would treat for gastroenteritis.  Recommend:  - Admit to step down  - Volume resucitation while BP is still fluid responsive  - Trend lactate  Until resolution  - Cdiff was negative  - GI pathogen panel  - NPO  Total critical care time: 30 min  Critical care time was exclusive of separately billable procedures and treating other patients.  Critical care was necessary to treat or prevent imminent or life-threatening deterioration.  Critical care was time spent personally by me on the following activities: development of treatment plan with patient and/or surrogate as well as nursing, discussions with consultants, evaluation of patient's response to treatment, examination of patient, obtaining history from patient or surrogate, ordering and performing treatments and interventions, ordering and review of laboratory studies, ordering and review of radiographic studies, pulse oximetry and re-evaluation of patient's condition.   Galvin Proffer, DO., MS  Pulmonary and Critical  Care Medicine   Pulmonary and Critical Care Medicine [facility] Pager: [phone]  12/23/2014, 8:27 PM

## 2014-12-23 NOTE — ED Notes (Signed)
Phlebotomy at bedside drawing second set of cultures at this time.

## 2014-12-23 NOTE — H&P (Signed)
[facility] History and Physical  Penny Ramos ZOX:[id number] DOB: May 07, 1927 DOA: 12/23/2014   PCP: No primary care provider on file.    Chief Complaint: Sepsis    HPI: Penny Ramos is a 81 y.o. female WF PMHx HTN, HLD, Atrial Fibrillation on Xarelto  Presents for acute abdominal pain and diarrhea. The patient had been having constipation over the last few days with multiple days without passing a bowel movement until today after taking colace. She then developed pain in her lower abdomen and started passing large amounts of loose stool. Denies blood in the stool. No dark or tarry colored stool. Denies episode like this before.   Patient states has had colonoscopy in the past which were clear last 1> continues ago. States usually eats a in town with her caregiver ~ 3 times a week, however eats mostly the same establishments. States caregiver has not had any similar symptoms.  Review of Systems: The patient denies fever, weight loss,, vision loss, decreased hearing, hoarseness, chest pain, syncope, dyspnea on exertion, peripheral edema, balance deficits, hemoptysis, melena, hematochezia, severe indigestion/heartburn, hematuria, incontinence, genital sores, muscle weakness, suspicious skin lesions, transient blindness, difficulty walking, depression, unusual weight change, abnormal bleeding, enlarged lymph nodes, angioedema, and breast masses.    TRAVEL HISTORY: NA   Consultants:  NA  Procedure/Significant Events:  10/29 CT abdomen pelvis without contrast;-Fluid stool throughout colon suggests diarrheal disease.- negative bowel obstruction; No oral contrast administered.   Culture  10/29 blood pending 10/29 stool pending 10/29 C. difficile pending 10/30 urine pending  Antibiotics:  Ciprofloxacin 10/29>> Flagyl 10/29>>   DVT prophylaxis:  SCD  Devices     LINES / TUBES:     Past Medical History  Diagnosis Date  . Hypertension   . High cholesterol    . Atrial fibrillation [facility])    Past Surgical History  Procedure Laterality Date  . Cholecystectomy    . Abdominal hysterectomy    . Back surgery    . Ankle surgery    . Appendectomy    . Cardioversion N/A 10/31/2014    Procedure: CARDIOVERSION;  Surgeon: [name], MD;  Location: [facility] ENDOSCOPY;  Service: Cardiovascular;  Laterality: N/A;   Social History:  reports that she has quit smoking. She does not have any smokeless tobacco history on file. She reports that she does not drink alcohol or use illicit drugs.  where does patient live--home, ALF, SNF? Home alone   Can patient participate in ADLs? Yes  Allergies  Allergen Reactions  . Strawberry Extract Anaphylaxis  . Nsaids Nausea And Vomiting    History reviewed. No pertinent family history.  Spoke with patient/son, states all family has HTN,  HLD   Prior to Admission medications   Medication Sig Start Date End Date Taking? Authorizing Provider  acetaminophen (TYLENOL) 500 MG tablet Take 500-1,000 mg by mouth every 6 (six) hours as needed for mild pain, moderate pain, fever or headache.    Yes Historical Provider, MD  atorvastatin (LIPITOR) 10 MG tablet Take 10 mg by mouth daily.   Yes Historical Provider, MD  clorazepate (TRANXENE) 7.5 MG tablet Take 7.5 mg by mouth 2 (two) times daily.  11/09/12  Yes Historical Provider, MD  diltiazem (CARDIZEM CD) 120 MG 24 hr capsule Take 1 capsule (120 mg total) by mouth daily. 10/23/14  Yes Marcy Salvo, NP  flecainide (TAMBOCOR) 100 MG tablet Take 1 tablet (100 mg total) by mouth 2 (two) times daily. 10/23/14  Yes Marcy Salvo, NP  lactose  free nutrition (BOOST) LIQD Take 237 mLs by mouth daily.   Yes Historical Provider, MD  lisinopril-hydrochlorothiazide (PRINZIDE,ZESTORETIC) 20-25 MG per tablet Take 1 tablet by mouth daily.   Yes Historical Provider, MD  Rivaroxaban (XARELTO) 15 MG TABS tablet Take 1 tablet (15 mg total) by mouth daily with supper. 10/10/14  Yes [name], MD   phenol (CHLORASEPTIC) 1.4 % LIQD Use as directed 1 spray in the mouth or throat as needed for throat irritation / pain.    Historical Provider, MD   Physical Exam: Filed Vitals:   12/23/14 2145 12/23/14 2215 12/23/14 2315 12/24/14 0015  BP: 118/41 106/41 102/80 122/41  Pulse: 90 92 95 98  Temp:   100.1 F (37.8 C)   TempSrc:   Oral   Resp: Height:    (1.6 m)   Weight:   71.1 kg (156 lb 12 oz)   SpO2: 96% 96% 95% 94%     General: A/O 4, abdominal pain, No acute respiratory distress Eyes: Negative headache, eye pain,  negative scleral hemorrhage ENT: Negative Runny nose, negative ear pain, negative tinnitus, negative gingival bleeding,  Neck:  Negative scars, masses, torticollis, lymphadenopathy, JVD Lungs: Clear to auscultation bilaterally without wheezes or crackles Cardiovascular: Regular rate and rhythm without murmur gallop or rub normal S1 and S2 Abdomen: Positive abdominal pain RUQ/Epigastric, negative dysphagia, nondistended, soft, bowel sounds positive, no rebound, no ascites, no appreciable mass Extremities: No significant cyanosis, clubbing, or edema bilateral lower extremities Psychiatric:  Negative depression, negative anxiety, negative fatigue, negative mania Neurologic:  Cranial nerves II through XII intact, tongue/uvula midline, all extremities muscle strength 5/5, sensation intact throughout, negative dysarthria, negative expressive aphasia, negative receptive aphasia.  Endocrine;   Hyperthyroid Negative sweaty, palpitations, visual disturbances, palpitations, visual disturbances, tremors. Positive diarrhea   Skin/Hemolytic/lymphatic; negative purpura, petechia,     Labs on Admission:  Basic Metabolic Panel:  Recent Labs Lab 12/23/14 1312 12/24/14 0020  NA 137  --   K 4.4  --   CL 104  --   CO2 17*  --   GLUCOSE 161*  --   BUN 49*  --   CREATININE 1.72*  --   CALCIUM 10.4*  --   MG  --  2.4  PHOS  --  4.6   Liver Function  Tests:  Recent Labs Lab 12/23/14 1312  AST 60*  ALT 17  ALKPHOS 388*  BILITOT 0.4  PROT 7.3  ALBUMIN 3.7    Recent Labs Lab 12/24/14 0020  AMYLASE 1154*   No results for input(s): AMMONIA in the last 168 hours. CBC:  Recent Labs Lab 12/23/14 1312 12/23/14 1548 12/24/14 0020  WBC 22.3*  --   --   NEUTROABS 19.1*  --   --   HGB 11.1* 10.1* 8.7*  HCT 36.9 33.1*  --   MCV 90.9  --   --   PLT 633*  --   --    Cardiac Enzymes: No results for input(s): CKTOTAL, CKMB, CKMBINDEX, TROPONINI in the last 168 hours.  BNP (last 3 results)  Recent Labs  10/09/14 0803  BNP 332.9*    ProBNP (last 3 results) No results for input(s): PROBNP in the last 8760 hours.  CBG: No results for input(s): GLUCAP in the last 168 hours.  Radiological Exams on Admission: Ct Abdomen Pelvis Wo Contrast  12/23/2014  CLINICAL DATA:  Lower abdominal pain.  Constipation for 3 days EXAM: CT ABDOMEN AND PELVIS WITHOUT CONTRAST TECHNIQUE:  Multidetector CT imaging of the abdomen and pelvis was performed following the standard protocol without IV contrast. COMPARISON:  None. FINDINGS: Lower chest: Lung bases are clear. Hepatobiliary: No focal hepatic lesion. Gallbladder is not identified. No biliary duct dilatation Pancreas: Pancreas is normal. No ductal dilatation. No pancreatic inflammation. Spleen: Normal spleen Adrenals/urinary tract: Adrenal glands and kidneys are normal. The ureters and bladder normal. Stomach/Bowel: Stomach is mildly distended with fluid. There is fluid in the duodenum. The proximal small bowel is normal caliber. The distal small bowel is likewise normal caliber. The cecum is fluid-filled. Appendix not identified. Transverse colon is normal caliber. There is fluid stool in the descending colon.several diverticula of the sigmoid colon without acute inflammation. Rectum is normal. Vascular/Lymphatic: Abdominal aorta is normal caliber with atherosclerotic calcification. There is no  retroperitoneal or periportal lymphadenopathy. No pelvic lymphadenopathy. Reproductive: Post hysterectomy. Musculoskeletal: Severe degenerative change of the lumbar spine without acute findings Other: No free fluid in the abdomen or pelvis. IMPRESSION: 1. Fluid stool throughout the colon suggests diarrheal disease. 2. No clear evidence of bowel obstruction. No oral contrast administered. 3. Severe atherosclerotic calcification of the aorta. 4. Severe degenerative changes lumbar spine. Electronically Signed   By: Genevive BiStewart  Edmunds M.D.   On: 12/23/2014 15:34    EKG:   Assessment/Plan Active Problems:   Enteritis   Acute diarrhea   Lactic acidosis   Lower abdominal pain   Sepsis, unspecified organism (HCC)   Infectious colitis   Other acute pancreatitis   Acute GI bleeding   Chronic atrial fibrillation (HCC)   HLD (hyperlipidemia)   Sepsis unspecified organism -On admission patient meets sepsis criteria elevated WBC, hypothermic, severely elevated lactic acid. -Enteritis vs Colitis   Enteritis/infectious Colitis/Diarrhea -C. difficile by PCR pending -GI pathogen panel pending -GI lactoferrin pending -Continue antibiotics until cultures return  Pancreatitis -NPO -Trend amylase  Acute GI bleed -Hemoccult-positive will hold Xarelto -If patient's H/H continues to drop overnight would consult GI in A.m.  Chronic Atrial Fibrillation on Xarelto/CHF? -Hold secondary to GI bleed -Echocardiogram pending -Strict in and out -Daily weight -Transfuse for hemoglobin<8  HTN,   -Patient has been hypotensive will hold all BP medication -Continue aggressive hydration. Normal saline 17500ml/hr  HLD, -Lipid panel pending    Code Status: DO NOT RESUSCITATE Family Communication: Son present during exam  Disposition Plan: Resolution sepsis   Care during the described time interval was provided by me .  I have reviewed this patient's available data, including medical history, events of  note, physical examination, and all test results as part of my evaluation. I have personally reviewed and interpreted all radiology studies.    Time spent: 90 minutes  Drema DallasWOODS, Osiel Stick J [facility] Pager [phone]  If 7PM-7AM, please contact night-coverage [email or web address] Password [email or web address] 12/24/2014, 4:25 AM

## 2014-12-23 NOTE — ED Notes (Signed)
Patient transported to CT 

## 2014-12-23 NOTE — ED Provider Notes (Signed)
CSN: [id number]     Arrival date & time 12/23/14  1219 History   First MD Initiated Contact with Patient 12/23/14 1244     Chief Complaint  Patient presents with  . Abdominal Pain  . Hypotension     (Consider location/radiation/quality/duration/timing/severity/associated sxs/prior Treatment) HPI Comments: 81 y.o. Female with history of atrial fibrillation on Xarelto, HTN, hypercholesterolemia presents for acute abdominal pain and diarrhea.  The patient had been having constipation over the last few days with multiple days without passing a bowel movement until today after taking colace.  She then developed pain in her lower abdomen and started passing large amounts of loose stool.  Denies blood in the stool.  No dark or tarry colored stool.  Denies episode like this before.    Patient is a 81 y.o. female presenting with abdominal pain.  Abdominal Pain Associated symptoms: constipation, diarrhea and fatigue   Associated symptoms: no chest pain, no chills, no cough, no dysuria, no fever, no nausea, no shortness of breath and no vomiting     Past Medical History  Diagnosis Date  . Hypertension   . High cholesterol   . Atrial fibrillation [facility])    Past Surgical History  Procedure Laterality Date  . Cholecystectomy    . Abdominal hysterectomy    . Back surgery    . Ankle surgery    . Appendectomy    . Cardioversion N/A 10/31/2014    Procedure: CARDIOVERSION;  Surgeon: [name], MD;  Location: [facility] ENDOSCOPY;  Service: Cardiovascular;  Laterality: N/A;   History reviewed. No pertinent family history. Social History  Substance Use Topics  . Smoking status: Former Games developer  . Smokeless tobacco: None  . Alcohol Use: No   OB History    No data available     Review of Systems  Constitutional: Positive for fatigue. Negative for fever, chills and appetite change.  HENT: Negative for congestion and postnasal drip.   Eyes: Negative for pain and visual disturbance.  Respiratory:  Negative for cough, chest tightness and shortness of breath.   Cardiovascular: Negative for chest pain and palpitations.  Gastrointestinal: Positive for abdominal pain, diarrhea and constipation. Negative for nausea, vomiting and blood in stool.  Genitourinary: Negative for dysuria, urgency, frequency and flank pain.  Musculoskeletal: Negative for myalgias and back pain.  Skin: Positive for pallor.  Neurological: Positive for weakness (generalized). Negative for dizziness, light-headedness and headaches.  Hematological: Bruises/bleeds easily (on Xarelto).      Allergies  Strawberry extract and Nsaids  Home Medications   Prior to Admission medications   Medication Sig Start Date End Date Taking? Authorizing Provider  acetaminophen (TYLENOL) 500 MG tablet Take 500-1,000 mg by mouth every 6 (six) hours as needed for mild pain, moderate pain, fever or headache.    Yes Historical Provider, MD  atorvastatin (LIPITOR) 10 MG tablet Take 10 mg by mouth daily.   Yes Historical Provider, MD  clorazepate (TRANXENE) 7.5 MG tablet Take 7.5 mg by mouth 2 (two) times daily.  11/09/12  Yes Historical Provider, MD  diltiazem (CARDIZEM CD) 120 MG 24 hr capsule Take 1 capsule (120 mg total) by mouth daily. 10/23/14  Yes Marcy Salvo, NP  flecainide (TAMBOCOR) 100 MG tablet Take 1 tablet (100 mg total) by mouth 2 (two) times daily. 10/23/14  Yes Marcy Salvo, NP  lactose free nutrition (BOOST) LIQD Take 237 mLs by mouth daily.   Yes Historical Provider, MD  lisinopril-hydrochlorothiazide (PRINZIDE,ZESTORETIC) 20-25 MG per tablet Take 1 tablet by mouth  daily.   Yes Historical Provider, MD  Rivaroxaban (XARELTO) 15 MG TABS tablet Take 1 tablet (15 mg total) by mouth daily with supper. 10/10/14  Yes [name], MD  phenol (CHLORASEPTIC) 1.4 % LIQD Use as directed 1 spray in the mouth or throat as needed for throat irritation / pain.    Historical Provider, MD   BP 113/54 mmHg  Pulse 73  Temp(Src) 96.9 F  (36.1 C) (Rectal)  Resp 18  Ht  (1.6 m)  Wt 144 lb (65.318 kg)  BMI 25.51 kg/m2  SpO2 95% Physical Exam  Constitutional: She is oriented to person, place, and time. She appears well-developed and well-nourished. She appears ill.  HENT:  Head: Normocephalic and atraumatic.  Right Ear: External ear normal.  Left Ear: External ear normal.  Mouth/Throat: Oropharynx is clear and moist. No oropharyngeal exudate.  Eyes: EOM are normal. Pupils are equal, round, and reactive to light.  Pale conjunctiva  Neck: Normal range of motion. Neck supple.  Cardiovascular: Normal rate.  An irregularly irregular rhythm present.  Pulmonary/Chest: Effort normal. No respiratory distress. She has no wheezes. She has no rales.  Abdominal: Soft. She exhibits no distension. There is tenderness (moderate along the lower abdomen).  Musculoskeletal: She exhibits no edema or tenderness.  Neurological: She is alert and oriented to person, place, and time.  Skin: Skin is warm and dry.  Vitals reviewed.   ED Course  Procedures (including critical care time) CRITICAL CARE Performed by: Larna Daughters   Total critical care time: 60 minutes  Critical care time was exclusive of separately billable procedures and treating other patients.  Critical care was necessary to treat or prevent imminent or life-threatening deterioration.  Critical care was time spent personally by me on the following activities: development of treatment plan with patient and/or surrogate as well as nursing, discussions with consultants, evaluation of patient's response to treatment, examination of patient, obtaining history from patient or surrogate, ordering and performing treatments and interventions, ordering and review of laboratory studies, ordering and review of radiographic studies, pulse oximetry and re-evaluation of patient's condition.   Labs Review Labs Reviewed  CBC WITH DIFFERENTIAL/PLATELET - Abnormal; Notable for the  following:    WBC 22.3 (*)    Hemoglobin 11.1 (*)    Platelets 633 (*)    Neutro Abs 19.1 (*)    All other components within normal limits  COMPREHENSIVE METABOLIC PANEL - Abnormal; Notable for the following:    CO2 17 (*)    Glucose, Bld 161 (*)    BUN 49 (*)    Creatinine, Ser 1.72 (*)    Calcium 10.4 (*)    AST 60 (*)    Alkaline Phosphatase 388 (*)    GFR calc non Af Amer 26 (*)    GFR calc Af Amer 30 (*)    Anion gap 16 (*)    All other components within normal limits  PROTIME-INR - Abnormal; Notable for the following:    Prothrombin Time 19.4 (*)    INR 1.63 (*)    All other components within normal limits  HEMOGLOBIN AND HEMATOCRIT, BLOOD - Abnormal; Notable for the following:    Hemoglobin 10.1 (*)    HCT 33.1 (*)    All other components within normal limits  I-STAT CG4 LACTIC ACID, ED - Abnormal; Notable for the following:    Lactic Acid, Venous 8.54 (*)    All other components within normal limits  POC OCCULT BLOOD, ED - Abnormal;  Notable for the following:    Fecal Occult Bld POSITIVE (*)    All other components within normal limits  I-STAT CG4 LACTIC ACID, ED - Abnormal; Notable for the following:    Lactic Acid, Venous 5.97 (*)    All other components within normal limits  C DIFFICILE QUICK SCREEN W PCR REFLEX  CULTURE, BLOOD (ROUTINE X 2)  CULTURE, BLOOD (ROUTINE X 2)  TYPE AND SCREEN  ABO/RH    Imaging Review Ct Abdomen Pelvis Wo Contrast  12/23/2014  CLINICAL DATA:  Lower abdominal pain.  Constipation for 3 days EXAM: CT ABDOMEN AND PELVIS WITHOUT CONTRAST TECHNIQUE: Multidetector CT imaging of the abdomen and pelvis was performed following the standard protocol without IV contrast. COMPARISON:  None. FINDINGS: Lower chest: Lung bases are clear. Hepatobiliary: No focal hepatic lesion. Gallbladder is not identified. No biliary duct dilatation Pancreas: Pancreas is normal. No ductal dilatation. No pancreatic inflammation. Spleen: Normal spleen  Adrenals/urinary tract: Adrenal glands and kidneys are normal. The ureters and bladder normal. Stomach/Bowel: Stomach is mildly distended with fluid. There is fluid in the duodenum. The proximal small bowel is normal caliber. The distal small bowel is likewise normal caliber. The cecum is fluid-filled. Appendix not identified. Transverse colon is normal caliber. There is fluid stool in the descending colon.several diverticula of the sigmoid colon without acute inflammation. Rectum is normal. Vascular/Lymphatic: Abdominal aorta is normal caliber with atherosclerotic calcification. There is no retroperitoneal or periportal lymphadenopathy. No pelvic lymphadenopathy. Reproductive: Post hysterectomy. Musculoskeletal: Severe degenerative change of the lumbar spine without acute findings Other: No free fluid in the abdomen or pelvis. IMPRESSION: 1. Fluid stool throughout the colon suggests diarrheal disease. 2. No clear evidence of bowel obstruction. No oral contrast administered. 3. Severe atherosclerotic calcification of the aorta. 4. Severe degenerative changes lumbar spine. Electronically Signed   By: Genevive Bi M.D.   On: 12/23/2014 15:34   I have personally reviewed and evaluated these images and lab results as part of my medical decision-making.   EKG Interpretation   Date/Time:  Saturday December 23 2014 12:38:49 EDT Ventricular Rate:  75 PR Interval:  218 QRS Duration: 89 QT Interval:  383 QTC Calculation: 428 R Axis:   41 Text Interpretation:  Sinus rhythm Borderline prolonged PR interval  Borderline T abnormalities, inferior leads Artifact No significant change  since last tracing Confirmed by Soley Harriss ([id number]) on 12/23/2014  12:46:15 PM      MDM  Patient hypotensive on presentation to the ER.  Acute onset of lower abdominal pain, diarrhea prior to presentation.  Patient resuscitated aggressively with fluids with good response in blood pressure.  Patient was never tachycardic but  is noted to be on rate controlling medications.  Initial lactic acid 8.5.  Cr 1.78 with new acute kidney injury.  Significant leukocytosis, mild hypothermia.  Blood cultures ordered.  Unable to given IV contrast for CT but CT with only findings of diarrhea and not overt areas of inflammation or pneumatosis.  Patient was started on Cipro/flagyl for GI infection.  Repeat Hb 10.1 from initial 11 but after 2 L NS.  Patient was noted to be guaiac positive and is on xarelto - will continue to monitor.  Because of acuteness of symptoms, abdominal pain, history of atrial fibrillation and still concern for possible mesenteric ischemia discussed with general surgeon on call who at this time had no recommendations other than IV fluid hydration and checking for C diff.  C diff sent.  Discussed with critical care physician  on call and at this point lactic acid has improved but remained about 5.  He recommended recheck of lactic acid at 7 PM which came back improved and near normal and so critical care had recommended admission to hospitalist.  Hospitalist agreed with admission and patient was admitted under their care for further treatment and evaluation.  Patient and son had been updated on all results and plan of care.  Medical wishes were discussed at bedside with son and patient who asked the patient be DNR as she did not want to be intubated or to undergo CPR - order placed in chart. Final diagnoses:  Lower abdominal pain    1. Acute diarrhea  2. Sepsis  3. Lactic acidosis    Leta BaptistEmily Roe Axie Hayne, MD 12/23/14 2333

## 2014-12-23 NOTE — ED Notes (Signed)
9/6

## 2014-12-23 NOTE — ED Notes (Signed)
Loverde, MD at bedside.

## 2014-12-23 NOTE — ED Notes (Signed)
Second 500cc bag started by EMS is infusing at this time.

## 2014-12-24 DIAGNOSIS — R197 Diarrhea, unspecified: Secondary | ICD-10-CM | POA: Diagnosis present

## 2014-12-24 DIAGNOSIS — K922 Gastrointestinal hemorrhage, unspecified: Secondary | ICD-10-CM | POA: Diagnosis present

## 2014-12-24 DIAGNOSIS — A419 Sepsis, unspecified organism: Secondary | ICD-10-CM | POA: Diagnosis present

## 2014-12-24 DIAGNOSIS — K858 Other acute pancreatitis without necrosis or infection: Secondary | ICD-10-CM | POA: Diagnosis present

## 2014-12-24 DIAGNOSIS — E785 Hyperlipidemia, unspecified: Secondary | ICD-10-CM | POA: Diagnosis present

## 2014-12-24 DIAGNOSIS — A09 Infectious gastroenteritis and colitis, unspecified: Secondary | ICD-10-CM | POA: Diagnosis present

## 2014-12-24 DIAGNOSIS — E872 Acidosis, unspecified: Secondary | ICD-10-CM | POA: Diagnosis present

## 2014-12-24 DIAGNOSIS — I482 Chronic atrial fibrillation, unspecified: Secondary | ICD-10-CM | POA: Diagnosis present

## 2014-12-24 DIAGNOSIS — R1030 Lower abdominal pain, unspecified: Secondary | ICD-10-CM | POA: Diagnosis present

## 2014-12-24 LAB — COMPREHENSIVE METABOLIC PANEL
ALBUMIN: 2.6 g/dL — AB (ref 3.5–5.0)
ALT: 17 U/L (ref 14–54)
ANION GAP: 10 (ref 5–15)
AST: 45 U/L — AB (ref 15–41)
Alkaline Phosphatase: 260 U/L — ABNORMAL HIGH (ref 38–126)
BUN: 52 mg/dL — ABNORMAL HIGH (ref 6–20)
CO2: 17 mmol/L — AB (ref 22–32)
Calcium: 8.4 mg/dL — ABNORMAL LOW (ref 8.9–10.3)
Chloride: 110 mmol/L (ref 101–111)
Creatinine, Ser: 1.66 mg/dL — ABNORMAL HIGH (ref 0.44–1.00)
GFR calc Af Amer: 31 mL/min — ABNORMAL LOW (ref >60)
GFR calc non Af Amer: 27 mL/min — ABNORMAL LOW (ref >60)
GLUCOSE: 138 mg/dL — AB (ref 65–99)
POTASSIUM: 4.6 mmol/L (ref 3.5–5.1)
SODIUM: 137 mmol/L (ref 135–145)
TOTAL PROTEIN: 5.2 g/dL — AB (ref 6.5–8.1)
Total Bilirubin: 0.3 mg/dL (ref 0.3–1.2)

## 2014-12-24 LAB — CBC WITH DIFFERENTIAL/PLATELET
BASOS PCT: 0 %
Basophils Absolute: 0 10*3/uL (ref 0.0–0.1)
Eosinophils Absolute: 0 10*3/uL (ref 0.0–0.7)
Eosinophils Relative: 0 %
HEMATOCRIT: 25.4 % — AB (ref 36.0–46.0)
HEMOGLOBIN: 8 g/dL — AB (ref 12.0–15.0)
Lymphocytes Relative: 7 %
Lymphs Abs: 1.1 10*3/uL (ref 0.7–4.0)
MCH: 28.2 pg (ref 26.0–34.0)
MCHC: 31.5 g/dL (ref 30.0–36.0)
MCV: 89.4 fL (ref 78.0–100.0)
MONOS PCT: 5 %
Monocytes Absolute: 0.7 10*3/uL (ref 0.1–1.0)
NEUTROS ABS: 13 10*3/uL — AB (ref 1.7–7.7)
NEUTROS PCT: 88 %
Platelets: 357 10*3/uL (ref 150–400)
RBC: 2.84 MIL/uL — AB (ref 3.87–5.11)
RDW: 12.8 % (ref 11.5–15.5)
WBC: 14.8 10*3/uL — AB (ref 4.0–10.5)

## 2014-12-24 LAB — LIPASE, BLOOD
LIPASE: 19 U/L (ref 11–51)
Lipase: 19 U/L (ref 11–51)

## 2014-12-24 LAB — LACTIC ACID, PLASMA
Lactic Acid, Venous: 3.4 mmol/L (ref 0.5–2.0)
Lactic Acid, Venous: 2.2 mmol/L (ref 0.5–2.0)
Lactic Acid, Venous: 1.6 mmol/L (ref 0.5–2.0)

## 2014-12-24 LAB — LIPID PANEL
CHOL/HDL RATIO: 2.2 ratio
CHOLESTEROL: 99 mg/dL (ref 0–200)
HDL: 46 mg/dL (ref >40)
LDL Cholesterol: 36 mg/dL (ref 0–99)
TRIGLYCERIDES: 84 mg/dL (ref <150)
VLDL: 17 mg/dL (ref 0–40)

## 2014-12-24 LAB — MAGNESIUM: MAGNESIUM: 2.4 mg/dL (ref 1.7–2.4)

## 2014-12-24 LAB — OCCULT BLOOD X 1 CARD TO LAB, STOOL: FECAL OCCULT BLD: POSITIVE — AB

## 2014-12-24 LAB — AMYLASE
AMYLASE: 1154 U/L — AB (ref 28–100)
Amylase: 579 U/L — ABNORMAL HIGH (ref 28–100)
Amylase: 535 U/L — ABNORMAL HIGH (ref 28–100)

## 2014-12-24 LAB — HEMOGLOBIN
HEMOGLOBIN: 8.7 g/dL — AB (ref 12.0–15.0)
Hemoglobin: 7.6 g/dL — ABNORMAL LOW (ref 12.0–15.0)
Hemoglobin: 7 g/dL — ABNORMAL LOW (ref 12.0–15.0)

## 2014-12-24 LAB — TSH: TSH: 0.718 u[IU]/mL (ref 0.350–4.500)

## 2014-12-24 LAB — MRSA PCR SCREENING: MRSA BY PCR: NEGATIVE

## 2014-12-24 LAB — PHOSPHORUS: Phosphorus: 4.6 mg/dL (ref 2.5–4.6)

## 2014-12-24 MED ORDER — DILTIAZEM HCL 30 MG PO TABS
30.0000 mg | ORAL_TABLET | Freq: Three times a day (TID) | ORAL | Status: DC
  Administered 2014-12-24 – 2014-12-26 (×7): 30 mg via ORAL
  Filled 2014-12-24 (×10): qty 1

## 2014-12-24 MED ORDER — SODIUM CHLORIDE 0.9 % IV BOLUS (SEPSIS)
500.0000 mL | Freq: Once | INTRAVENOUS | Status: AC
  Administered 2014-12-24: 500 mL via INTRAVENOUS

## 2014-12-24 MED ORDER — CIPROFLOXACIN IN D5W 400 MG/200ML IV SOLN
400.0000 mg | INTRAVENOUS | Status: DC
  Administered 2014-12-24: 400 mg via INTRAVENOUS
  Filled 2014-12-24 (×2): qty 200

## 2014-12-24 MED ORDER — METRONIDAZOLE IN NACL 5-0.79 MG/ML-% IV SOLN
500.0000 mg | Freq: Three times a day (TID) | INTRAVENOUS | Status: DC
  Administered 2014-12-24 – 2015-01-01 (×24): 500 mg via INTRAVENOUS
  Filled 2014-12-24 (×28): qty 100

## 2014-12-24 MED ORDER — SODIUM CHLORIDE 0.9 % IV BOLUS (SEPSIS)
1000.0000 mL | Freq: Once | INTRAVENOUS | Status: AC
  Administered 2014-12-24: 1000 mL via INTRAVENOUS

## 2014-12-24 NOTE — Progress Notes (Signed)
CRITICAL VALUE ALERT  Critical value received:  Lactic 3.4  Date of notification:  12/24/2014   Time of notification:  0130  Critical value read back: yes  Nurse who received alert:  Altamese CarolinaEverette,Tyshae Stair A   MD notified (1st page):  Merdis DelayK. Schorr NP  Time of first page:  0145  MD notified (2nd page):    Time of second page:  Responding MD:  Merdis DelayK. Schorr NP  Time MD responded:  0200  Orders to give small 500cc bolus to pt and recheck lactic.  Will continue to monitor

## 2014-12-24 NOTE — Progress Notes (Signed)
CRITICAL VALUE ALERT  Critical value received:  First set blood cultures aerobic bottle Gram negative rods  Date of notification:  12/24/14  Time of notification:  0935  Critical value read back:Yes.    Nurse who received alert:  D. Madelin Rearillon, RN  MD notified (1st page):  Dr. Isidoro Donningai  Time of first page:  [phone]  MD notified (2nd page):  Time of second page:  Responding MD:  Dr. Isidoro Donningai  Time MD responded:  [phone]

## 2014-12-24 NOTE — Progress Notes (Signed)
Hgb 7.0 notified K. Schorr.

## 2014-12-24 NOTE — Progress Notes (Signed)
Utilization review completed.  

## 2014-12-24 NOTE — Progress Notes (Signed)
[facility]                                                                              Patient Demographics  Penny Ramos, is a 81 y.o. female, DOB - 04-11-1927, ZOX:[id number]  Admit date - 12/23/2014   Admitting Physician Drema Dallas, MD  Outpatient Primary MD for the patient is No primary care provider on file.  LOS - 1   Chief Complaint  Patient presents with  . Abdominal Pain  . Hypotension       Brief HPI   Per Dr. Joseph Art admit note on 12/23/14 Penny Ramos is a 81 y.o. female WF PMHx HTN, HLD, Atrial Fibrillation on Xarelto presented for acute abdominal pain and diarrhea. The patient had been having constipation over the last few days with multiple days without passing a bowel movement until on the day of admission after taking colace. She reported several stools throughout the day. She developed pain in her lower abdomen and started passing large amounts of loose stool. Patient denied any hematochezia or melena.  Patient states has had colonoscopy in the past which were clear last 1> continues ago. States usually eats a in town with her caregiver ~ 3 times a week, however eats mostly the same establishments. States caregiver has not had any similar symptoms.  Assessment & Plan    Principal problem Sepsis/SIRS likely due to acute enteritis, pancreatitis -On admission patient meets sepsis criteria elevated WBC, hypothermic, severely elevated lactic acid. -Enteritis vs Colitis   Active problems Enteritis/infectious Colitis/Diarrhea - Diarrhea is improving, no BM since last night C. difficile negative, follow GI pathogen panel - Follow stool cultures, fecal lactoferrin - Continue Cipro, Flagyl.  - Start clears  Pancreatitis: Unclear etiology, lipid panel showed no hypertriglyceridemia, has cholecystectomy -No significant abdominal pain, amylase improving, lipase negative - Continue IV fluids, start clear liquid diet, lactic acidosis  improved  ? GI bleed and dehydration colitis, enteritis - Patient denies any hematochezia or melena. Only fecal occult blood test positive - Hemoccult-positive, will hold Xarelto for now  Chronic Atrial Fibrillation on Xarelto/CHF? -Hold xarelto, FOBT positive -Echocardiogram pending, EF status unknown - Rate controlled  HTN,  -Patient has been hypotensive will hold all BP medication -Continue aggressive hydration.   Acute kidney injury - Likely due to dehydration, diarrhea, #1 #2 - Continue IV fluid hydration  HLD, -Lipid panel showed LDL 36, holding statins for now   Generalized debility - PTOT evaluation, out of bed, lives at home has caregiver during the day  Code Status: Full CODE STATUS  Family Communication: Discussed in detail with the patient, all imaging results, lab results explained to the patient    Disposition Plan: Hopefully DC next 24-48 hours  Time Spent in minutes   25 minutes  Procedures  CT abdomen  Consults   None  DVT Prophylaxis  SCD's  Medications  Scheduled Meds: . ciprofloxacin  400 mg Intravenous Q24H  . clorazepate  7.5 mg Oral BID  . diltiazem  30 mg Oral 3 times per day  . flecainide  100 mg Oral BID  . metronidazole  500 mg Intravenous Q8H  Continuous Infusions: . sodium chloride 125 mL/hr at 12/24/14 0836   PRN Meds:.acetaminophen **OR** acetaminophen, oxyCODONE   Antibiotics   Anti-infectives    Start     Dose/Rate Route Frequency Ordered Stop   12/24/14 1800  ciprofloxacin (CIPRO) IVPB 400 mg     400 mg 200 mL/hr over 60 Minutes Intravenous Every 24 hours 12/24/14 0412     12/24/14 0600  metroNIDAZOLE (FLAGYL) IVPB 500 mg     500 mg 100 mL/hr over 60 Minutes Intravenous Every 8 hours 12/24/14 0412     12/23/14 1600  ciprofloxacin (CIPRO) IVPB 400 mg     400 mg 200 mL/hr over 60 Minutes Intravenous  Once 12/23/14 1557 12/23/14 2119   12/23/14 1600  metroNIDAZOLE (FLAGYL) IVPB 500 mg     500 mg 100 mL/hr over 60  Minutes Intravenous  Once 12/23/14 1557 12/23/14 2119        Subjective:   Penny Ramos was seen and examined today.  Just feels weak otherwise no diarrhea. Patient denies dizziness, chest pain, shortness of breath, N/V/D/C, new weakness, numbess, tingling. No acute events overnight.    Objective:   Blood pressure 129/44, pulse 107, temperature 99 F (37.2 C), temperature source Oral, resp. rate 21, height 5\' 3"  (1.6 m), weight 71.3 kg (157 lb 3 oz), SpO2 94 %.  Wt Readings from Last 3 Encounters:  12/24/14 71.3 kg (157 lb 3 oz)  10/23/14 69.31 kg (152 lb 12.8 oz)  10/09/14 70.308 kg (155 lb)     Intake/Output Summary (Last 24 hours) at 12/24/14 0907 Last data filed at 12/24/14 0426  Gross per 24 hour  Intake   1900 ml  Output    275 ml  Net   1625 ml    Exam  General: Alert and oriented x 3, NAD  HEENT:  PERRLA, EOMI, Anicteric Sclera, mucous membranes moist.   Neck: Supple, no JVD, no masses  CVS: S1 S2 auscultated, no rubs, murmurs or gallops. Regular rate and rhythm.  Respiratory: Clear to auscultation bilaterally, no wheezing, rales or rhonchi  Abdomen: Soft, mildly tender in the lower quadrants, nondistended, + bowel sounds  Ext: no cyanosis clubbing or edema  Neuro: AAOx3, Cr N's II- XII. Strength 5/5 upper and lower extremities bilaterally  Skin: No rashes  Psych: Normal affect and demeanor, alert and oriented x3    Data Review   Micro Results Recent Results (from the past 240 hour(s))  C difficile quick scan w PCR reflex     Status: None   Collection Time: 12/23/14  4:50 PM  Result Value Ref Range Status   C Diff antigen NEGATIVE NEGATIVE Final   C Diff toxin NEGATIVE NEGATIVE Final   C Diff interpretation Negative for toxigenic C. difficile  Final  MRSA PCR Screening     Status: None   Collection Time: 12/24/14 12:00 AM  Result Value Ref Range Status   MRSA by PCR NEGATIVE NEGATIVE Final    Comment:        The GeneXpert MRSA Assay  (FDA approved for NASAL specimens only), is one component of a comprehensive MRSA colonization surveillance program. It is not intended to diagnose MRSA infection nor to guide or monitor treatment for MRSA infections.     Radiology Reports Ct Abdomen Pelvis Wo Contrast  12/23/2014  CLINICAL DATA:  Lower abdominal pain.  Constipation for 3 days EXAM: CT ABDOMEN AND PELVIS WITHOUT CONTRAST TECHNIQUE: Multidetector CT imaging of the abdomen and pelvis was performed following the  standard protocol without IV contrast. COMPARISON:  None. FINDINGS: Lower chest: Lung bases are clear. Hepatobiliary: No focal hepatic lesion. Gallbladder is not identified. No biliary duct dilatation Pancreas: Pancreas is normal. No ductal dilatation. No pancreatic inflammation. Spleen: Normal spleen Adrenals/urinary tract: Adrenal glands and kidneys are normal. The ureters and bladder normal. Stomach/Bowel: Stomach is mildly distended with fluid. There is fluid in the duodenum. The proximal small bowel is normal caliber. The distal small bowel is likewise normal caliber. The cecum is fluid-filled. Appendix not identified. Transverse colon is normal caliber. There is fluid stool in the descending colon.several diverticula of the sigmoid colon without acute inflammation. Rectum is normal. Vascular/Lymphatic: Abdominal aorta is normal caliber with atherosclerotic calcification. There is no retroperitoneal or periportal lymphadenopathy. No pelvic lymphadenopathy. Reproductive: Post hysterectomy. Musculoskeletal: Severe degenerative change of the lumbar spine without acute findings Other: No free fluid in the abdomen or pelvis. IMPRESSION: 1. Fluid stool throughout the colon suggests diarrheal disease. 2. No clear evidence of bowel obstruction. No oral contrast administered. 3. Severe atherosclerotic calcification of the aorta. 4. Severe degenerative changes lumbar spine. Electronically Signed   By: Genevive Bi M.D.   On:  12/23/2014 15:34    CBC  Recent Labs Lab 12/23/14 1312 12/23/14 1548 12/24/14 0020 12/24/14 0420  WBC 22.3*  --   --  14.8*  HGB 11.1* 10.1* 8.7* 8.0*  HCT 36.9 33.1*  --  25.4*  PLT 633*  --   --  357  MCV 90.9  --   --  89.4  MCH 27.3  --   --  28.2  MCHC 30.1  --   --  31.5  RDW 12.6  --   --  12.8  LYMPHSABS 2.7  --   --  1.1  MONOABS 0.4  --   --  0.7  EOSABS 0.1  --   --  0.0  BASOSABS 0.0  --   --  0.0    Chemistries   Recent Labs Lab 12/23/14 1312 12/24/14 0020 12/24/14 0420  NA 137  --  137  K 4.4  --  4.6  CL 104  --  110  CO2 17*  --  17*  GLUCOSE 161*  --  138*  BUN 49*  --  52*  CREATININE 1.72*  --  1.66*  CALCIUM 10.4*  --  8.4*  MG  --  2.4  --   AST 60*  --  45*  ALT 17  --  17  ALKPHOS 388*  --  260*  BILITOT 0.4  --  0.3   ------------------------------------------------------------------------------------------------------------------ estimated creatinine clearance is 23.5 mL/min (by C-G formula based on Cr of 1.66). ------------------------------------------------------------------------------------------------------------------ No results for input(s): HGBA1C in the last 72 hours. ------------------------------------------------------------------------------------------------------------------  Recent Labs  12/24/14 0420  CHOL 99  HDL 46  LDLCALC 36  TRIG 84  CHOLHDL 2.2   ------------------------------------------------------------------------------------------------------------------  Recent Labs  12/24/14 0020  TSH 0.718   ------------------------------------------------------------------------------------------------------------------ No results for input(s): VITAMINB12, FOLATE, FERRITIN, TIBC, IRON, RETICCTPCT in the last 72 hours.  Coagulation profile  Recent Labs Lab 12/23/14 1312  INR 1.63*    No results for input(s): DDIMER in the last 72 hours.  Cardiac Enzymes No results for input(s): CKMB, TROPONINI,  MYOGLOBIN in the last 168 hours.  Invalid input(s): CK ------------------------------------------------------------------------------------------------------------------ Invalid input(s): POCBNP  No results for input(s): GLUCAP in the last 72 hours.   Liset Mcmonigle M.D. [facility] 12/24/2014, 9:07 AM  Pager: [phone] Between 7am to 7pm - call  Pager - [phone]  After 7pm go to [email or web address] - password [email or web address]  Call night coverage person covering after 7pm

## 2014-12-24 NOTE — Progress Notes (Signed)
Pt transferred from ED to 3s01 [facility]  Pt oriented to unit with call bell within reach.  Son is bedside.  Will continue to monitor

## 2014-12-25 ENCOUNTER — Inpatient Hospital Stay (HOSPITAL_COMMUNITY): Payer: Medicare Other

## 2014-12-25 DIAGNOSIS — R7881 Bacteremia: Secondary | ICD-10-CM

## 2014-12-25 LAB — CBC
HCT: 20.7 % — ABNORMAL LOW (ref 36.0–46.0)
HCT: 28.9 % — ABNORMAL LOW (ref 36.0–46.0)
Hemoglobin: 6.5 g/dL — CL (ref 12.0–15.0)
Hemoglobin: 9.4 g/dL — ABNORMAL LOW (ref 12.0–15.0)
MCH: 27.3 pg (ref 26.0–34.0)
MCH: 28 pg (ref 26.0–34.0)
MCHC: 31.4 g/dL (ref 30.0–36.0)
MCHC: 32.5 g/dL (ref 30.0–36.0)
MCV: 87 fL (ref 78.0–100.0)
MCV: 86 fL (ref 78.0–100.0)
PLATELETS: 311 10*3/uL (ref 150–400)
Platelets: 311 10*3/uL (ref 150–400)
RBC: 2.38 MIL/uL — ABNORMAL LOW (ref 3.87–5.11)
RBC: 3.36 MIL/uL — ABNORMAL LOW (ref 3.87–5.11)
RDW: 12.9 % (ref 11.5–15.5)
RDW: 14.2 % (ref 11.5–15.5)
WBC: 10 10*3/uL (ref 4.0–10.5)
WBC: 11.6 10*3/uL — AB (ref 4.0–10.5)

## 2014-12-25 LAB — LIPASE, BLOOD: LIPASE: 18 U/L (ref 11–51)

## 2014-12-25 LAB — PREPARE RBC (CROSSMATCH)

## 2014-12-25 LAB — COMPREHENSIVE METABOLIC PANEL
ALT: 15 U/L (ref 14–54)
AST: 36 U/L (ref 15–41)
Albumin: 2.2 g/dL — ABNORMAL LOW (ref 3.5–5.0)
Alkaline Phosphatase: 176 U/L — ABNORMAL HIGH (ref 38–126)
Anion gap: 6 (ref 5–15)
BILIRUBIN TOTAL: 0.8 mg/dL (ref 0.3–1.2)
BUN: 35 mg/dL — AB (ref 6–20)
CO2: 20 mmol/L — ABNORMAL LOW (ref 22–32)
CREATININE: 1.36 mg/dL — AB (ref 0.44–1.00)
Calcium: 8.4 mg/dL — ABNORMAL LOW (ref 8.9–10.3)
Chloride: 116 mmol/L — ABNORMAL HIGH (ref 101–111)
GFR calc Af Amer: 40 mL/min — ABNORMAL LOW (ref >60)
GFR, EST NON AFRICAN AMERICAN: 34 mL/min — AB (ref >60)
Glucose, Bld: 110 mg/dL — ABNORMAL HIGH (ref 65–99)
POTASSIUM: 3.7 mmol/L (ref 3.5–5.1)
Sodium: 142 mmol/L (ref 135–145)
TOTAL PROTEIN: 4.7 g/dL — AB (ref 6.5–8.1)

## 2014-12-25 LAB — URINE CULTURE

## 2014-12-25 LAB — HEMOGLOBIN AND HEMATOCRIT, BLOOD
HCT: 25.8 % — ABNORMAL LOW (ref 36.0–46.0)
HEMOGLOBIN: 8.1 g/dL — AB (ref 12.0–15.0)

## 2014-12-25 MED ORDER — LORAZEPAM 2 MG/ML IJ SOLN
0.5000 mg | Freq: Four times a day (QID) | INTRAMUSCULAR | Status: DC | PRN
  Administered 2014-12-25 – 2014-12-26 (×3): 0.5 mg via INTRAVENOUS
  Filled 2014-12-25 (×3): qty 1

## 2014-12-25 MED ORDER — DEXTROSE 5 % IV SOLN
2.0000 g | INTRAVENOUS | Status: DC
  Administered 2014-12-25 – 2014-12-31 (×7): 2 g via INTRAVENOUS
  Filled 2014-12-25 (×8): qty 2

## 2014-12-25 MED ORDER — AMIODARONE HCL IN DEXTROSE 360-4.14 MG/200ML-% IV SOLN
30.0000 mg/h | INTRAVENOUS | Status: DC
Start: 2014-12-26 — End: 2014-12-28
  Administered 2014-12-26 – 2014-12-27 (×3): 30 mg/h via INTRAVENOUS
  Filled 2014-12-25 (×9): qty 200

## 2014-12-25 MED ORDER — SODIUM CHLORIDE 0.9 % IV SOLN
Freq: Once | INTRAVENOUS | Status: AC
  Administered 2014-12-25: 08:00:00 via INTRAVENOUS

## 2014-12-25 MED ORDER — AMIODARONE HCL IN DEXTROSE 360-4.14 MG/200ML-% IV SOLN
60.0000 mg/h | INTRAVENOUS | Status: AC
  Administered 2014-12-25 (×2): 60 mg/h via INTRAVENOUS
  Filled 2014-12-25: qty 200

## 2014-12-25 MED ORDER — SODIUM CHLORIDE 0.9 % IV BOLUS (SEPSIS)
1000.0000 mL | Freq: Once | INTRAVENOUS | Status: AC
  Administered 2014-12-25: 1000 mL via INTRAVENOUS

## 2014-12-25 MED ORDER — AMIODARONE LOAD VIA INFUSION
150.0000 mg | Freq: Once | INTRAVENOUS | Status: AC
  Administered 2014-12-25: 150 mg via INTRAVENOUS
  Filled 2014-12-25: qty 83.34

## 2014-12-25 MED ORDER — METOPROLOL TARTRATE 1 MG/ML IV SOLN
INTRAVENOUS | Status: AC
  Filled 2014-12-25: qty 5

## 2014-12-25 MED ORDER — METOPROLOL TARTRATE 1 MG/ML IV SOLN
5.0000 mg | Freq: Once | INTRAVENOUS | Status: AC
  Administered 2014-12-25: 5 mg via INTRAVENOUS

## 2014-12-25 MED ORDER — SODIUM CHLORIDE 0.9 % IV SOLN
Freq: Once | INTRAVENOUS | Status: AC
  Administered 2014-12-25: 03:00:00 via INTRAVENOUS

## 2014-12-25 NOTE — Progress Notes (Signed)
Hgb 6.5. Midlevel notified.

## 2014-12-25 NOTE — Progress Notes (Signed)
MD Rai notified of pt's HR afib 130-150's. Order given for IV lopressor. Will continue to monitor.

## 2014-12-25 NOTE — Clinical Social Work Placement (Signed)
   CLINICAL SOCIAL WORK PLACEMENT  NOTE  Date:  12/25/2014  Patient Details  Name: Estanislado PandySheila S Curiale MRN: [id number] Date of Birth: 12/16/1927  Clinical Social Work is seeking post-discharge placement for this patient at the Skilled  Nursing Facility level of care (*CSW will initial, date and re-position this form in  chart as items are completed):  Yes   Patient/family provided with [facility] Social Work Department's list of facilities offering this level of care within the geographic area requested by the patient (or if unable, by the patient's family).  Yes   Patient/family informed of their freedom to choose among providers that offer the needed level of care, that participate in Medicare, Medicaid or managed care program needed by the patient, have an available bed and are willing to accept the patient.  Yes   Patient/family informed of [facility]'s ownership interest in [facility] and [facility], as well as of the fact that they are under no obligation to receive care at these facilities.  PASRR submitted to EDS on 12/25/14     PASRR number received on 12/25/14     Existing PASRR number confirmed on       FL2 transmitted to all facilities in geographic area requested by pt/family on 12/25/14     FL2 transmitted to all facilities within larger geographic area on       Patient informed that his/her managed care company has contracts with or will negotiate with certain facilities, including the following:            Patient/family informed of bed offers received.  Patient chooses bed at       Physician recommends and patient chooses bed at      Patient to be transferred to   on  .  Patient to be transferred to facility by       Patient family notified on   of transfer.  Name of family member notified:        PHYSICIAN Please sign FL2     Additional Comment:    _______________________________________________ Gwynne EdingerBibbs, Rhyland Hinderliter, LCSW 12/25/2014,  3:31 PM

## 2014-12-25 NOTE — Progress Notes (Signed)
  Echocardiogram 2D Echocardiogram has been performed.  Sheralyn BoatmanWest, Hasan Douse R 12/25/2014, 2:00 PM

## 2014-12-25 NOTE — Consult Note (Addendum)
CARDIOLOGY CONSULT NOTE  Patient ID: Penny Ramos MRN: [id number] DOB/AGE: 05/17/1927 81 y.o.  Admit date: 12/23/2014 Referring Physician  Ripu Rai, MD  Primary Physician:  No primary care provider on file. Reason for Consultation  A. Fib  HPI: Penny Ramos  is a 81 y.o. female  With h/o PAF, Hypertension, hyperlipidemia who presented with diarrhoea on Saturday. Now diagnosed with acute enteritis and pancreatitis and due to hypotension Cardizem was discontinued and this morning went into A. Fib with RVR. She has been on Flecainide  She has h/o  PAF admitted on 10/09/2014 with A. Fib with RVR, but converted to sinus on medical therapy. Discharged home with OP work-up. She has undergone echocardiogram on 10/17/2014 which essentially revealed preserved ejection fraction of the left ventricle, mild mitral regurgitation and mild to moderate tricuspid regurgitation and moderate LV. She has h/o hypertension. Nuclear stress test on 10/13/2014 revealed no evidence of ischemia.  cardioversion on 9/6//16 due to symptomatic A. Fib. I had earlier discussed with Dr. Isidoro Donning and started on IV amio due to RVR. Since then HR improved. Presently no chest pain of dyspnea. Diarrhoea persists. No fever, PND or orthopnea. Son present at bedside.   Past Medical History  Diagnosis Date  . Hypertension   . High cholesterol   . Atrial fibrillation [facility])      Past Surgical History  Procedure Laterality Date  . Cholecystectomy    . Abdominal hysterectomy    . Back surgery    . Ankle surgery    . Appendectomy    . Cardioversion N/A 10/31/2014    Procedure: CARDIOVERSION;  Surgeon: [name], MD;  Location: [facility] ENDOSCOPY;  Service: Cardiovascular;  Laterality: N/A;     Pertinent family history No premature CAD   Social History: Social History   Social History  . Marital Status: Widowed    Spouse Name: N/A  . Number of Children: N/A  . Years of Education: N/A   Occupational History  . Not on file.    Social History Main Topics  . Smoking status: Former Games developer  . Smokeless tobacco: Not on file  . Alcohol Use: No  . Drug Use: No  . Sexual Activity: Not on file   Other Topics Concern  . Not on file   Social History Narrative     Prescriptions prior to admission  Medication Sig Dispense Refill Last Dose  . acetaminophen (TYLENOL) 500 MG tablet Take 500-1,000 mg by mouth every 6 (six) hours as needed for mild pain, moderate pain, fever or headache.    12/23/2014 at 0800  . atorvastatin (LIPITOR) 10 MG tablet Take 10 mg by mouth daily.   12/23/2014 at Unknown time  . clorazepate (TRANXENE) 7.5 MG tablet Take 7.5 mg by mouth 2 (two) times daily.    12/23/2014 at Unknown time  . diltiazem (CARDIZEM CD) 120 MG 24 hr capsule Take 1 capsule (120 mg total) by mouth daily. 30 capsule 1 12/23/2014 at Unknown time  . flecainide (TAMBOCOR) 100 MG tablet Take 1 tablet (100 mg total) by mouth 2 (two) times daily. 60 tablet 1 12/23/2014 at Unknown time  . lactose free nutrition (BOOST) LIQD Take 237 mLs by mouth daily.   12/23/2014 at Unknown time  . lisinopril-hydrochlorothiazide (PRINZIDE,ZESTORETIC) 20-25 MG per tablet Take 1 tablet by mouth daily.   12/23/2014 at Unknown time  . Rivaroxaban (XARELTO) 15 MG TABS tablet Take 1 tablet (15 mg total) by mouth daily with supper. 30 tablet 6 12/22/2014 at  Unknown time  . phenol (CHLORASEPTIC) 1.4 % LIQD Use as directed 1 spray in the mouth or throat as needed for throat irritation / pain.   >30 days     ROS: General: no fevers/chills/night sweats Eyes: no blurry vision, diplopia, or amaurosis ENT: no sore throat or hearing loss Resp: no cough, wheezing, or hemoptysis CV: no edema or palpitations GI: no abdominal pain, nausea, vomiting, diarrhea, or constipation GU: no dysuria, frequency, or hematuria Skin: no rash Neuro: no headache, numbness, tingling, or weakness of extremities Musculoskeletal: no joint pain or swelling Heme: no bleeding,  DVT, or easy bruising Endo: no polydipsia or polyuria    Physical Exam: Blood pressure 121/75, pulse 143, temperature 99.5 F (37.5 C), temperature source Oral, resp. rate 30, height 5\' 3"  (1.6 m), weight 72.6 kg (160 lb 0.9 oz), SpO2 94 %.   General appearance: alert, cooperative, appears stated age, no distress and mildly obese Eyes: negative findings: lids and lashes normal Neck: no adenopathy, no carotid bruit, no JVD, supple, symmetrical, trachea midline and thyroid not enlarged, symmetric, no tenderness/mass/nodules Neck: JVP - normal, carotids 2+= without bruits Resp: Clear Chest wall: no tenderness Cardio: irregular S1 variable, S2 normal, I-II/VI SEM in apex. No click, rub or gallop GI: soft, non-tender; bowel sounds normal; no masses, no organomegaly and Obese Extremities: extremities normal, atraumatic, no cyanosis or edema Pulses: 2+ and symmetric Skin: Skin color, texture, turgor normal. No rashes or lesions Neurologic: Grossly normal  Labs:   Lab Results  Component Value Date   WBC 11.6* 12/25/2014   HGB 9.4* 12/25/2014   HCT 28.9* 12/25/2014   MCV 86.0 12/25/2014   PLT 311 12/25/2014    Recent Labs Lab 12/25/14 0140  NA 142  K 3.7  CL 116*  CO2 20*  BUN 35*  CREATININE 1.36*  CALCIUM 8.4*  PROT 4.7*  BILITOT 0.8  ALKPHOS 176*  ALT 15  AST 36  GLUCOSE 110*    Lipid Panel     Component Value Date/Time   CHOL 99 12/24/2014 0420   TRIG 84 12/24/2014 0420   HDL 46 12/24/2014 0420   CHOLHDL 2.2 12/24/2014 0420   VLDL 17 12/24/2014 0420   LDLCALC 36 12/24/2014 0420    BNP (last 3 results)  Recent Labs  10/09/14 0803  BNP 332.9*    HEMOGLOBIN A1C Lab Results  Component Value Date   HGBA1C 6.7* 10/10/2014   MPG 146 10/10/2014    Cardiac Panel (last 3 results)  Recent Labs  10/09/14 1541 10/09/14 2004 10/10/14 0140  TROPONINI 0.04* 0.04* 0.04*    Lab Results  Component Value Date   TROPONINI 0.04* 10/10/2014      TSH  Recent Labs  10/09/14 0803 12/24/14 0020  TSH 1.191 0.718    EKG: A. Fib with RVR  Echo: 12/25/14: Left ventricle: The cavity size was normal. Wall thickness was normal. Systolic function was normal. The estimated ejection fraction was in the range of 55% to 60%. Wall motion was normal; there were no regional wall motion abnormalities. - Aortic valve: Valve area (VTI): 1.52 cm^2. Valve area (Vmax): 1.49 cm^2. Valve area (Vmean): 1.54 cm^2. - Mitral valve: Valve area by continuity equation (using LVOT flow): 1.14 cm^2. - Left atrium: The atrium was mildly dilated. - Pulmonary arteries: Systolic pressure was moderately increased. PA peak pressure: 43 mm Hg (S). - Pericardium, extracardiac: A trivial pericardial effusion was identified.   Radiology: No results found.  Scheduled Meds: . cefTRIAXone (ROCEPHIN)  IV  2 g Intravenous Q24H  . clorazepate  7.5 mg Oral BID  . diltiazem  30 mg Oral 3 times per day  . metronidazole  500 mg Intravenous Q8H   Continuous Infusions: . sodium chloride Stopped (12/25/14 0805)  . amiodarone 60 mg/hr (12/25/14 2152)   Followed by  . [START ON 12/26/2014] amiodarone     PRN Meds:.acetaminophen **OR** acetaminophen, LORazepam, oxyCODONE  ASSESSMENT AND PLAN:  1. A. Fib with rVR CHA2DS2-VASCScore: Risk Score 5, Yearly risk of stroke 6.7%. OAC Has Bled: Score 2. Estimated risk of major bleeding at 1 year with OAC 1.8-3.2% 2. DM new 3. Acute renal failure 4. Acute bacterial enteritis  Rec: Continue IV amio for now and I will consider change to PO. I will continue to follow. Anticoagulation on hold due to anemia, but this appears to be stable. Received transfusion yesterday. No obvious source of GI bleed per GI. Due to high risk for stroke, recommend resuming anticoagulation once stable and   [name], MD 12/25/2014, 10:14 PM [facility] Pager: [phone] Office: [phone] If no answer Cell  [phone]

## 2014-12-25 NOTE — Consult Note (Signed)
Subjective:   HPI  The patient is an 81 year old female who was admitted to the hospital with a 2-3 day history of diarrhea and bilateral lower abdominal cramping. She had a CT of the abdomen and pelvis which did not show any acute findings but did show a lot of fluid in her colon. It was suspected that she had a gastroenteritis. On admission she was found to be hypotensive with an elevated lactate and required fluid resuscitation. She had no complaints of nausea or vomiting and has not been on any recent antibiotics. Stool for C. difficile was negative. Stool GI pathogens panel is pending. We were asked to see her because of a drop in her hemoglobin from 9.4-6.5, without any obvious evidence of gross gastrointestinal bleeding. She reports having had a colonoscopy when she was [age] and nothing serious was found. She tells me that she was told after that she didn't need any more colonoscopies. I do not see a report from 3 years ago but I do see a report in the [facility] system where Dr. Barnett AbuWiseman did a colonoscopy in 2005 with findings of diverticulosis and some colon polyps.  Review of Systems Currently no chest pain or shortness of breath  Past Medical History  Diagnosis Date  . Hypertension   . High cholesterol   . Atrial fibrillation [facility](HCC)    Past Surgical History  Procedure Laterality Date  . Cholecystectomy    . Abdominal hysterectomy    . Back surgery    . Ankle surgery    . Appendectomy    . Cardioversion N/A 10/31/2014    Procedure: CARDIOVERSION;  Surgeon: Yates DecampJay Ganji, MD;  [facility];  Service: Cardiovascular;  Laterality: N/A;   Social History   Social History  . Marital Status: Widowed    Spouse Name: N/A  . Number of Children: N/A  . Years of Education: N/A   Occupational History  . Not on file.   Social History Main Topics  . Smoking status: Former Games developermoker  . Smokeless tobacco: Not on file  . Alcohol Use: No  . Drug Use: No  . Sexual Activity: Not on file    Other Topics Concern  . Not on file   Social History Narrative   family history is not on file.  Current facility-administered medications:  .  0.9 %  sodium chloride infusion, , Intravenous, Continuous, Ripudeep Jenna LuoK Rai, MD, Stopped at 12/25/14 0805 .  acetaminophen (TYLENOL) tablet 650 mg, 650 mg, Oral, Q6H PRN, 650 mg at 12/24/14 2208 **OR** acetaminophen (TYLENOL) suppository 650 mg, 650 mg, Rectal, Q6H PRN, Drema Dallasurtis J Woods, MD .  ciprofloxacin (CIPRO) IVPB 400 mg, 400 mg, Intravenous, Q24H, Drema Dallasurtis J Woods, MD, 400 mg at 12/24/14 1725 .  clorazepate (TRANXENE) tablet 7.5 mg, 7.5 mg, Oral, BID, Drema Dallasurtis J Woods, MD, 7.5 mg at 12/25/14 1328 .  diltiazem (CARDIZEM) tablet 30 mg, 30 mg, Oral, 3 times per day, Ripudeep Jenna LuoK Rai, MD, Stopped at 12/25/14 0600 .  flecainide (TAMBOCOR) tablet 100 mg, 100 mg, Oral, BID, Drema Dallasurtis J Woods, MD, 100 mg at 12/25/14 1054 .  LORazepam (ATIVAN) injection 0.5 mg, 0.5 mg, Intravenous, Q6H PRN, Ripudeep K Rai, MD, 0.5 mg at 12/25/14 0818 .  metroNIDAZOLE (FLAGYL) IVPB 500 mg, 500 mg, Intravenous, Q8H, Drema Dallasurtis J Woods, MD, 500 mg at 12/25/14 1310 .  oxyCODONE (Oxy IR/ROXICODONE) immediate release tablet 5 mg, 5 mg, Oral, Q4H PRN, Drema Dallasurtis J Woods, MD Allergies  Allergen Reactions  . Strawberry Extract Anaphylaxis  .  Nsaids Nausea And Vomiting     Objective:     BP 120/59 mmHg  Pulse 133  Temp(Src) 98.4 F (36.9 C) (Oral)  Resp 24  Ht  (1.6 m)  Wt 72.6 kg (160 lb 0.9 oz)  BMI 28.36 kg/m2  SpO2 92%  She is alert. She does not appear in any acute distress.  Heart: Tachycardic  Lungs: Clear  Abdomen: Bowel sounds present, soft, nontender  Laboratory No components found for: D1    Assessment:     #1. Gastroenteritis  #2. Anemia. There does not appear to be active GI bleeding. I suspect that the rapid drop in hemoglobin and hematocrit was a result of rehydration.  #3. Atrial fibrillation with recent cardioversion      Plan:     Follow  clinically. Follow hemoglobin and hematocrit. Watch for signs of active bleeding. Transfuse as needed. Follow clinically. Lab Results  Component Value Date   HGB 9.4* 12/25/2014   HGB 8.1* 12/25/2014   HGB 6.5* 12/25/2014   HCT 28.9* 12/25/2014   HCT 25.8* 12/25/2014   HCT 20.7* 12/25/2014   ALKPHOS 176* 12/25/2014   ALKPHOS 260* 12/24/2014   ALKPHOS 388* 12/23/2014   AST 36 12/25/2014   AST 45* 12/24/2014   AST 60* 12/23/2014   ALT 15 12/25/2014   ALT 17 12/24/2014   ALT 17 12/23/2014   AMYLASE 535* 12/24/2014   AMYLASE 579* 12/24/2014   AMYLASE 1154* 12/24/2014

## 2014-12-25 NOTE — Progress Notes (Signed)
Pt's HR afib 120-150's, pt instructed to perform valsalvas maneuver no changed noticed in HR. MD Rai notified, order for IV bolus given. Will continue to monitor.

## 2014-12-25 NOTE — NC FL2 (Signed)
[place] MEDICAID FL2 LEVEL OF CARE SCREENING TOOL     IDENTIFICATION  Patient Name: Penny Ramos Birthdate: [birth year] Sex: female Admission Date (Current Location): 12/23/2014  [facility] and [place] Number:     Facility and Address:  [facility], [address], [place], [place] [address]      Provider Number: [id number]  Attending Physician Name and Address:  Cathren Harsh, MD  Relative Name and Phone Number:       Current Level of Care: Hospital Recommended Level of Care: Skilled Nursing Facility Prior Approval Number:    Date Approved/Denied:   PASRR Number: [id number]  Discharge Plan: SNF    Current Diagnoses: Patient Active Problem List   Diagnosis Date Noted  . Acute diarrhea   . Lactic acidosis   . Lower abdominal pain   . Sepsis, unspecified organism (HCC)   . Infectious colitis   . Other acute pancreatitis   . Acute GI bleeding   . Chronic atrial fibrillation (HCC)   . HLD (hyperlipidemia)   . Enteritis 12/23/2014  . Atrial fibrillation (HCC) 10/21/2014  . PAF (paroxysmal atrial fibrillation) (HCC) 10/21/2014  . Atrial fibrillation with RVR (HCC) 10/09/2014  . Essential hypertension 10/09/2014    Orientation ACTIVITIES/SOCIAL BLADDER RESPIRATION    Self, Time, Situation, Place  Active Continent Normal  BEHAVIORAL SYMPTOMS/MOOD NEUROLOGICAL BOWEL NUTRITION STATUS      Incontinent Diet (NPO)  PHYSICIAN VISITS COMMUNICATION OF NEEDS Height & Weight Skin    Verbally  (160 cm) 160 lbs. Normal          AMBULATORY STATUS RESPIRATION     (Mod Assist) Normal      Personal Care Assistance Level of Assistance  Bathing, Dressing Bathing Assistance: Limited assistance   Dressing Assistance: Limited assistance      Functional Limitations Info                SPECIAL CARE FACTORS FREQUENCY                      Additional Factors Info  Allergies, Isolation Precautions, Code Status Code Status  Info: DNR Allergies Info: Allergies: Strawberry Extract, Nsaids     Isolation Precautions Info: UV disinfection     Current Medications (12/25/2014): Current Facility-Administered Medications  Medication Dose Route Frequency Provider Last Rate Last Dose  . 0.9 %  sodium chloride infusion   Intravenous Continuous Ripudeep Jenna Luo, MD   Stopped at 12/25/14 0805  . acetaminophen (TYLENOL) tablet 650 mg  650 mg Oral Q6H PRN Drema Dallas, MD   650 mg at 12/24/14 2208   Or  . acetaminophen (TYLENOL) suppository 650 mg  650 mg Rectal Q6H PRN Drema Dallas, MD      . ciprofloxacin (CIPRO) IVPB 400 mg  400 mg Intravenous Q24H Drema Dallas, MD   400 mg at 12/24/14 1725  . clorazepate (TRANXENE) tablet 7.5 mg  7.5 mg Oral BID Drema Dallas, MD   7.5 mg at 12/25/14 1328  . diltiazem (CARDIZEM) tablet 30 mg  30 mg Oral 3 times per day Ripudeep Jenna Luo, MD   Stopped at 12/25/14 0600  . flecainide (TAMBOCOR) tablet 100 mg  100 mg Oral BID Drema Dallas, MD   100 mg at 12/25/14 1054  . LORazepam (ATIVAN) injection 0.5 mg  0.5 mg Intravenous Q6H PRN Ripudeep K Rai, MD   0.5 mg at 12/25/14 0818  . metroNIDAZOLE (FLAGYL) IVPB 500  mg  500 mg Intravenous Q8H Drema Dallasurtis J Woods, MD   500 mg at 12/25/14 1310  . oxyCODONE (Oxy IR/ROXICODONE) immediate release tablet 5 mg  5 mg Oral Q4H PRN Drema Dallasurtis J Woods, MD      . sodium chloride 0.9 % bolus 1,000 mL  1,000 mL Intravenous Once Ripudeep Jenna LuoK Rai, MD       Do not use this list as official medication orders. Please verify with discharge summary.  Discharge Medications:   Medication List    ASK your doctor about these medications        acetaminophen 500 MG tablet  Commonly known as:  TYLENOL  Take 500-1,000 mg by mouth every 6 (six) hours as needed for mild pain, moderate pain, fever or headache.     atorvastatin 10 MG tablet  Commonly known as:  LIPITOR  Take 10 mg by mouth daily.     CHLORASEPTIC 1.4 % Liqd  Generic drug:  phenol  Use as directed 1  spray in the mouth or throat as needed for throat irritation / pain.     clorazepate 7.5 MG tablet  Commonly known as:  TRANXENE  Take 7.5 mg by mouth 2 (two) times daily.     diltiazem 120 MG 24 hr capsule  Commonly known as:  CARDIZEM CD  Take 1 capsule (120 mg total) by mouth daily.     flecainide 100 MG tablet  Commonly known as:  TAMBOCOR  Take 1 tablet (100 mg total) by mouth 2 (two) times daily.     lactose free nutrition Liqd  Take 237 mLs by mouth daily.     lisinopril-hydrochlorothiazide 20-25 MG tablet  Commonly known as:  PRINZIDE,ZESTORETIC  Take 1 tablet by mouth daily.     Rivaroxaban 15 MG Tabs tablet  Commonly known as:  XARELTO  Take 1 tablet (15 mg total) by mouth daily with supper.        Relevant Imaging Results:  Relevant Lab Results:  Recent Labs    Additional Information    Evelene Roussin, LCSW

## 2014-12-25 NOTE — Progress Notes (Addendum)
[facility]                                                                              Patient Demographics  Penny Ramos, is a 81 y.o. female, DOB - [birth year], ZOX:[id number]  Admit date - 12/23/2014   Admitting Physician Drema Dallas, MD  Outpatient Primary MD for the patient is No primary care provider on file.  LOS - 2   Chief Complaint  Patient presents with  . Abdominal Pain  . Hypotension       Brief HPI   Per Dr. Joseph Art admit note on 12/23/14 Penny Ramos is a 81 y.o. female WF PMHx HTN, HLD, Atrial Fibrillation on Xarelto presented for acute abdominal pain and diarrhea. The patient had been having constipation over the last few days with multiple days without passing a bowel movement until on the day of admission after taking colace. She reported several stools throughout the day. She developed pain in her lower abdomen and started passing large amounts of loose stool. Patient denied any hematochezia or melena.  Patient states has had colonoscopy in the past which were clear, more than 5 years ago. States usually eats a in town with her caregiver ~ 3 times a week, however eats mostly the same establishments. States caregiver has not had any similar symptoms.  Assessment & Plan    Principal problem Gram-negative rod bacteremia/Sepsis likely due to acute enteritis, pancreatitis -On admission patient meets sepsis criteria elevated WBC, hypothermic, severely elevated lactic acid. - Unfortunately no urine analysis or cultures were sent in the ER, will check UA and culture - Follow blood cultures and sensitivities  Active problems Enteritis/infectious Colitis/Diarrhea - C. difficile negative - Follow stool cultures, fecal lactoferrin - Continue IV ciprofloxacin and Flagyl, follow blood cultures and sensitivities,  - Continue clears  Acute on chronic blood loss anemia, GI bleed and colitis - Patient had presented with hemoglobin of 11.2 after  several episodes of diarrhea, may have had hemoconcentration at that time, was placed on IV fluid hydration. No gross GI bleeding per patient denied any hematochezia or melena. - FOBT was positive, hemoglobin trending down, some of it could be hemodilution. Hb 6.5 last night, transfuse 2 units packed RBCs. - GI consult called, d/w Dr Evette Cristal - Patient was on xarelto prior to admission for A. fib which has been held since admission   Pancreatitis: Unclear etiology, lipid panel showed no hypertriglyceridemia, has cholecystectomy - No significant abdominal pain, amylase improving, lipase negative - Continue IV fluids, continue clears   atrial fibrillation with RVR - Hold xarelto, FOBT positive - In a.m., heart rate was controlled however after the PT evaluation this afternoon, patient went into rapid Afib. No significant improvement after IV Lopressor. Patient was on oral Cardizem. BP down to 90's, HR still elevated in 120-130's despite fluids. - called cards consult, Discussed in detail with patient's cardiologist, Dr. Jacinto Halim, recommended discontinuing flecainide and start on IV amiodarone drip. Dr Jacinto Halim to follow for further recommendations.   Hypotension with history of HTN,  -Patient has been hypotensive, hold all antihypertensives   Acute kidney injury: Patient presented with creatinine of  1.7, likely prerenal - Likely due to dehydration, diarrhea, #1 #2, Improving - Continue IV fluid hydration  HLD, -Lipid panel showed LDL 36, holding statins for now   Generalized debility - PTOT evaluation, out of bed, lives at home has caregiver during the day. Patient was in process to be moved to a retirement facility per her family. Will likely need short-term rehabilitation at discharge.   Anxiety - Patient is quite anxious today, placed on low-dose Ativan. Per patient's son, she is gets very anxious with issues, which is normal for her and requested for antianxiety medications for the  hospitalization. He also reported that patient was being moved to a retirement facility prior to admission which also plays a role in her anxiety.  Code Status: Full CODE STATUS  Family Communication: Discussed in detail with the patient, all imaging results, lab results explained to the patient and son on the phone   Disposition Plan:   Time Spent in minutes   25 minutes  Procedures  CT abdomen  Consults   Gastroenterology   DVT Prophylaxis  SCD's  Medications  Scheduled Meds: . ciprofloxacin  400 mg Intravenous Q24H  . clorazepate  7.5 mg Oral BID  . diltiazem  30 mg Oral 3 times per day  . flecainide  100 mg Oral BID  . metronidazole  500 mg Intravenous Q8H   Continuous Infusions: . sodium chloride Stopped (12/25/14 0805)   PRN Meds:.acetaminophen **OR** acetaminophen, LORazepam, oxyCODONE   Antibiotics   Anti-infectives    Start     Dose/Rate Route Frequency Ordered Stop   12/24/14 1800  ciprofloxacin (CIPRO) IVPB 400 mg     400 mg 200 mL/hr over 60 Minutes Intravenous Every 24 hours 12/24/14 0412     12/24/14 0600  metroNIDAZOLE (FLAGYL) IVPB 500 mg     500 mg 100 mL/hr over 60 Minutes Intravenous Every 8 hours 12/24/14 0412     12/23/14 1600  ciprofloxacin (CIPRO) IVPB 400 mg     400 mg 200 mL/hr over 60 Minutes Intravenous  Once 12/23/14 1557 12/23/14 2119   12/23/14 1600  metroNIDAZOLE (FLAGYL) IVPB 500 mg     500 mg 100 mL/hr over 60 Minutes Intravenous  Once 12/23/14 1557 12/23/14 2119        Subjective:   Penny Ramos was seen and examined today. Very anxious today, feels very weak, no gross GI bleeding. No fevers or abdominal pain. No diarrhea.  Patient denies chest pain, shortness of breath, numbess, tingling. Patient also anxious about the blood transfusion.   Objective:   Blood pressure 135/58, pulse 97, temperature 98.3 F (36.8 C), temperature source Oral, resp. rate 29, height  (1.6 m), weight 72.6 kg (160 lb 0.9 oz), SpO2 95  %.  Wt Readings from Last 3 Encounters:  12/25/14 72.6 kg (160 lb 0.9 oz)  10/23/14 69.31 kg (152 lb 12.8 oz)  10/09/14 70.308 kg (155 lb)     Intake/Output Summary (Last 24 hours) at 12/25/14 1011 Last data filed at 12/25/14 2440  Gross per 24 hour  Intake 2724.17 ml  Output   1000 ml  Net 1724.17 ml    Exam  General: Alert and oriented x 3, NAD, anxious   HEENT:  PERRLA, EOMI, Anicteric Sclera, mucous membranes moist.   Neck: Supple, no JVD, no masses  CVS: S1 S2 clear, RRR  Respiratory: CTAB  Abdomen: Soft, NT, ND  Ext: no cyanosis clubbing or edema  Neuro: no new deficits   Skin: No  rashes  Psych:  anxious , alert and oriented x3    Data Review   Micro Results Recent Results (from the past 240 hour(s))  C difficile quick scan w PCR reflex     Status: None   Collection Time: 12/23/14  4:50 PM  Result Value Ref Range Status   C Diff antigen NEGATIVE NEGATIVE Final   C Diff toxin NEGATIVE NEGATIVE Final   C Diff interpretation Negative for toxigenic C. difficile  Final  Blood culture (routine x 2)     Status: None (Preliminary result)   Collection Time: 12/23/14  6:05 PM  Result Value Ref Range Status   Specimen Description BLOOD LEFT ANTECUBITAL  Final   Special Requests BOTTLES DRAWN AEROBIC AND ANAEROBIC 5CC  Final   Culture  Setup Time   Final    GRAM NEGATIVE RODS AEROBIC BOTTLE ONLY CRITICAL RESULT CALLED TO, READ BACK BY AND VERIFIED WITH: D DILLON,RN AT 0933 12/24/14 BY L BENFIELD    Culture GRAM NEGATIVE RODS  Final   Report Status PENDING  Incomplete  Blood culture (routine x 2)     Status: None (Preliminary result)   Collection Time: 12/23/14  6:19 PM  Result Value Ref Range Status   Specimen Description BLOOD RIGHT HAND  Final   Special Requests BOTTLES DRAWN AEROBIC ONLY 2CC  Final   Culture NO GROWTH 2 DAYS  Final   Report Status PENDING  Incomplete  MRSA PCR Screening     Status: None   Collection Time: 12/24/14 12:00 AM  Result  Value Ref Range Status   MRSA by PCR NEGATIVE NEGATIVE Final    Comment:        The GeneXpert MRSA Assay (FDA approved for NASAL specimens only), is one component of a comprehensive MRSA colonization surveillance program. It is not intended to diagnose MRSA infection nor to guide or monitor treatment for MRSA infections.     Radiology Reports Ct Abdomen Pelvis Wo Contrast  12/23/2014  CLINICAL DATA:  Lower abdominal pain.  Constipation for 3 days EXAM: CT ABDOMEN AND PELVIS WITHOUT CONTRAST TECHNIQUE: Multidetector CT imaging of the abdomen and pelvis was performed following the standard protocol without IV contrast. COMPARISON:  None. FINDINGS: Lower chest: Lung bases are clear. Hepatobiliary: No focal hepatic lesion. Gallbladder is not identified. No biliary duct dilatation Pancreas: Pancreas is normal. No ductal dilatation. No pancreatic inflammation. Spleen: Normal spleen Adrenals/urinary tract: Adrenal glands and kidneys are normal. The ureters and bladder normal. Stomach/Bowel: Stomach is mildly distended with fluid. There is fluid in the duodenum. The proximal small bowel is normal caliber. The distal small bowel is likewise normal caliber. The cecum is fluid-filled. Appendix not identified. Transverse colon is normal caliber. There is fluid stool in the descending colon.several diverticula of the sigmoid colon without acute inflammation. Rectum is normal. Vascular/Lymphatic: Abdominal aorta is normal caliber with atherosclerotic calcification. There is no retroperitoneal or periportal lymphadenopathy. No pelvic lymphadenopathy. Reproductive: Post hysterectomy. Musculoskeletal: Severe degenerative change of the lumbar spine without acute findings Other: No free fluid in the abdomen or pelvis. IMPRESSION: 1. Fluid stool throughout the colon suggests diarrheal disease. 2. No clear evidence of bowel obstruction. No oral contrast administered. 3. Severe atherosclerotic calcification of the  aorta. 4. Severe degenerative changes lumbar spine. Electronically Signed   By: Genevive Bi M.D.   On: 12/23/2014 15:34    CBC  Recent Labs Lab 12/23/14 1312 12/23/14 1548  12/24/14 0420 12/24/14 1250 12/24/14 2015 12/25/14 0140 12/25/14 7829  WBC 22.3*  --   --  14.8*  --   --  10.0  --   HGB 11.1* 10.1*  < > 8.0* 7.6* 7.0* 6.5* 8.1*  HCT 36.9 33.1*  --  25.4*  --   --  20.7* 25.8*  PLT 633*  --   --  357  --   --  311  --   MCV 90.9  --   --  89.4  --   --  87.0  --   MCH 27.3  --   --  28.2  --   --  27.3  --   MCHC 30.1  --   --  31.5  --   --  31.4  --   RDW 12.6  --   --  12.8  --   --  12.9  --   LYMPHSABS 2.7  --   --  1.1  --   --   --   --   MONOABS 0.4  --   --  0.7  --   --   --   --   EOSABS 0.1  --   --  0.0  --   --   --   --   BASOSABS 0.0  --   --  0.0  --   --   --   --   < > = values in this interval not displayed.  Chemistries   Recent Labs Lab 12/23/14 1312 12/24/14 0020 12/24/14 0420 12/25/14 0140  NA 137  --  137 142  K 4.4  --  4.6 3.7  CL 104  --  110 116*  CO2 17*  --  17* 20*  GLUCOSE 161*  --  138* 110*  BUN 49*  --  52* 35*  CREATININE 1.72*  --  1.66* 1.36*  CALCIUM 10.4*  --  8.4* 8.4*  MG  --  2.4  --   --   AST 60*  --  45* 36  ALT 17  --  17 15  ALKPHOS 388*  --  260* 176*  BILITOT 0.4  --  0.3 0.8   ------------------------------------------------------------------------------------------------------------------ estimated creatinine clearance is 28.9 mL/min (by C-G formula based on Cr of 1.36). ------------------------------------------------------------------------------------------------------------------ No results for input(s): HGBA1C in the last 72 hours. ------------------------------------------------------------------------------------------------------------------  Recent Labs  12/24/14 0420  CHOL 99  HDL 46  LDLCALC 36  TRIG 84  CHOLHDL 2.2    ------------------------------------------------------------------------------------------------------------------  Recent Labs  12/24/14 0020  TSH 0.718   ------------------------------------------------------------------------------------------------------------------ No results for input(s): VITAMINB12, FOLATE, FERRITIN, TIBC, IRON, RETICCTPCT in the last 72 hours.  Coagulation profile  Recent Labs Lab 12/23/14 1312  INR 1.63*    No results for input(s): DDIMER in the last 72 hours.  Cardiac Enzymes No results for input(s): CKMB, TROPONINI, MYOGLOBIN in the last 168 hours.  Invalid input(s): CK ------------------------------------------------------------------------------------------------------------------ Invalid input(s): POCBNP  No results for input(s): GLUCAP in the last 72 hours.   RAI,RIPUDEEP M.D. [facility] 12/25/2014, 10:11 AM  Pager: [phone] Between 7am to 7pm - call Pager - [phone]  After 7pm go to [email or web address] - password [email or web address]  Call night coverage person covering after 7pm

## 2014-12-25 NOTE — Clinical Social Work Note (Signed)
Clinical Social Work Assessment  Patient Details  Name: Penny Ramos MRN: [id number] Date of Birth: 1927-09-20  Date of referral:  12/25/14               Reason for consult:  Facility Placement              Housing/Transportation Living arrangements for the past 2 months:  Single Family Home Source of Information:  Patient Patient Interpreter Needed:  None Criminal Activity/Legal Involvement Pertinent to Current Situation/Hospitalization:  No - Comment as needed Significant Relationships:  None Lives with:  Adult Children Do you feel safe going back to the place where you live?   No  Need for family participation in patient care:  Yes (Comment)   Care giving concerns: No  Social Worker assessment / plan:  CSW met with the pt at the bedside. CSW introduced self and purpose of the visit. CSW discussed SNF rehab. CSW explained insurance and its relation to SNF placement.  CSW provided the pt with a SNF list. The pt reported that she would like to talk to her son first. CSW answered all questions in which the pt inquired about. CSW will continue to follow this pt and assist with discharge as needed.   Employment status:  Retired Forensic scientist:  [employer] PT Recommendations:  [place] / Referral to community resources:  [place]  Patient/Family's Response to care:  The pt shared that the staff has cared for her well.   Patient/Family's Understanding of and Emotional Response to Diagnosis, Current Treatment, and Prognosis: Pt acknowledged her diagnosis and her current treatment. Pt shared wishing that she could go home, but understand that she need additional help prior to going home.   Emotional Assessment Appearance:  Appears stated age Attitude/Demeanor/Rapport:   (Calm ) Affect (typically observed):  Accepting, Appropriate Orientation:  Oriented to Situation, Oriented to  Time, Oriented to Place, Oriented to Self Alcohol /  Substance use:  Not Applicable Psych involvement (Current and /or in the community):  No (Comment)  Discharge Needs  Concerns to be addressed:  Denies Needs/Concerns at this time Readmission within the last 30 days:  No Current discharge risk:  None Barriers to Discharge:  No Barriers Identified   Aniko Finnigan, LCSW 12/25/2014, 3:29 PM

## 2014-12-25 NOTE — Evaluation (Signed)
Physical Therapy Evaluation Patient Details Name: Penny Ramos MRN: [id number] DOB: 02/17/1928 Today's Date: 12/25/2014   History of Present Illness  Pt is a 81 y/o F who presents w/ enteritis w/ PMH of a-fib, back and ankle surgery.    Clinical Impression  Pt admitted with above diagnosis. Pt currently with functional limitations due to the deficits listed below (see PT Problem List). HR 100-110 at start of session and reaches 160 after pt performs sit<>stand.  HR returns to 110-120's after ~1 minutes of lying supine in bed.  RN aware.  Pt's goal is to get back home w/ her care attendant as soon as possible but is agreeable to ST SNF if needed to reach appropriate level of Ind prior to return home.  She currently requires mod assist for bed mobility and sit<>stand 2/2 generalized fatigue and may benefit from use of Stedy during future sessions. Pt will benefit from skilled PT to increase their independence and safety with mobility to allow discharge to the venue listed below.      Follow Up Recommendations SNF;Supervision for mobility/OOB (w/ goal of HHPT if pt reaches appropriate level of Ind)    Equipment Recommendations  None recommended by PT    Recommendations for Other Services OT consult     Precautions / Restrictions Precautions Precautions: Fall Precaution Comments: Pt says she might have had 1-2 falls in the past year because she tripped, did not require hospitalization. Restrictions Weight Bearing Restrictions: No      Mobility  Bed Mobility Overal bed mobility: Needs Assistance Bed Mobility: Supine to Sit;Sit to Supine     Supine to sit: Mod assist Sit to supine: Mod assist   General bed mobility comments: Mod assist providing 1 person HHA for pt to pull up to sitting, additionally using bed rail to pull.  Cues for sequencing.    Transfers Overall transfer level: Needs assistance Equipment used: Rolling walker (2 wheeled) Transfers: Sit to/from Stand Sit to  Stand: Mod assist         General transfer comment: Mod assist to power up to standing and to steady RW as pt attempts to pull.  Cues for proper hand placement and technique.  Upon standing pt's Bil LEs unsteady and pt becomes very anxious and immediately sits down on bed.  At this point pt's HR up to 160 and was assisted back to supine.  Ambulation/Gait                Stairs            Wheelchair Mobility    Modified Rankin (Stroke Patients Only)       Balance Overall balance assessment: Needs assistance;History of Falls Sitting-balance support: Bilateral upper extremity supported;Feet supported Sitting balance-Leahy Scale: Fair Sitting balance - Comments: Close min guard for pt's safety as she reports fatigue and has elevated HR   Standing balance support: Bilateral upper extremity supported;During functional activity Standing balance-Leahy Scale: Poor Standing balance comment: Requires RW and physical assist for support                             Pertinent Vitals/Pain Pain Assessment: No/denies pain    Home Living Family/patient expects to be discharged to:: Skilled nursing facility Living Arrangements: Alone   Type of Home: House Home Access: Stairs to enter Entrance Stairs-Rails: Can reach both Entrance Stairs-Number of Steps: 5 Home Layout: Two level;Able to live on main level with bedroom/bathroom (  Pt says she can't remember the last time she went upstairs) Home Equipment: Walker - 4 wheels;Walker - 2 wheels;Cane - single point (rollator) Additional Comments: Pt is planning to move to [facility] sometime in the near future where her daughter lives to live in an Assistive Living facility.      Prior Function Level of Independence: Needs assistance   Gait / Transfers Assistance Needed: PTA pt used rollator at all times.  Care attendant does most of shopping for pt as she becomes fatigued walking long distances.  ADL's / Homemaking Assistance  Needed: Pt reports her care attendant assists her w/ dressing when it is more difficult but pt is able to do some of her dressing on her own.  Pt is Ind w/ bathing but has care attendant in room just in case.    Comments: Care provider is at home from 8am-8pm.  She gets OOB and gets into bed on her own and "pray I don't fall"     Hand Dominance        Extremity/Trunk Assessment   Upper Extremity Assessment: Overall WFL for tasks assessed           Lower Extremity Assessment: Generalized weakness;RLE deficits/detail;LLE deficits/detail RLE Deficits / Details: grossly 3/5, pt reports this is not her baseline LLE Deficits / Details: grossly 3/5, pt reports this is not her baseline     Communication   Communication: No difficulties  Cognition Arousal/Alertness: Awake/alert Behavior During Therapy: WFL for tasks assessed/performed Overall Cognitive Status: Within Functional Limits for tasks assessed                      General Comments General comments (skin integrity, edema, etc.): HR 100-110 at start of session and reaches 160 after pt performs sit<>stand.  HR returns to 110-120's after ~1 minutes of lying supine in bed.  RN aware.  Pt's goal is to get back home w/ her care attendant as soon as possible but is agreeable to ST SNF if needed to reach appropriate level of Ind prior to return home.    Exercises        Assessment/Plan    PT Assessment Patient needs continued PT services  PT Diagnosis Difficulty walking;Abnormality of gait;Generalized weakness   PT Problem List Decreased strength;Decreased range of motion;Decreased activity tolerance;Decreased balance;Decreased mobility;Decreased knowledge of use of DME;Decreased safety awareness;Decreased knowledge of precautions  PT Treatment Interventions DME instruction;Gait training;Stair training;Functional mobility training;Therapeutic exercise;Therapeutic activities;Balance training;Neuromuscular  re-education;Patient/family education   PT Goals (Current goals can be found in the Care Plan section) Acute Rehab PT Goals Patient Stated Goal: to go to rehab before home if necessary PT Goal Formulation: With patient Time For Goal Achievement: 01/15/15 Potential to Achieve Goals: Good    Frequency Min 3X/week   Barriers to discharge Inaccessible home environment;Decreased caregiver support 5 steps to enter home and assist only from 8am-8pm    Co-evaluation               End of Session Equipment Utilized During Treatment: Gait belt Activity Tolerance: Patient limited by fatigue;Treatment limited secondary to medical complications (Comment) (HR up to 160 after sit<>stand) Patient left: in bed;with call bell/phone within reach;with SCD's reapplied Nurse Communication: Mobility status;Need for lift equipment;Precautions;Other (comment) (If med appropriate later for transfer,may benefit from stedy)         Time: 1205-1235 PT Time Calculation (min) (ACUTE ONLY): 30 min   Charges:   PT Evaluation $Initial PT Evaluation Tier I: 1  Procedure PT Treatments $Therapeutic Activity: 8-22 mins   PT G CodesMichail Jewels PT, DPT [phone] Pager: [phone] 12/25/2014, 1:42 PM

## 2014-12-26 LAB — CULTURE, BLOOD (ROUTINE X 2)

## 2014-12-26 LAB — CBC
HEMATOCRIT: 31.7 % — AB (ref 36.0–46.0)
HEMOGLOBIN: 10.1 g/dL — AB (ref 12.0–15.0)
MCH: 27 pg (ref 26.0–34.0)
MCHC: 31.9 g/dL (ref 30.0–36.0)
MCV: 84.8 fL (ref 78.0–100.0)
Platelets: 312 10*3/uL (ref 150–400)
RBC: 3.74 MIL/uL — AB (ref 3.87–5.11)
RDW: 14.8 % (ref 11.5–15.5)
WBC: 11 10*3/uL — ABNORMAL HIGH (ref 4.0–10.5)

## 2014-12-26 LAB — TYPE AND SCREEN
ABO/RH(D): O POS
Antibody Screen: NEGATIVE
UNIT DIVISION: 0
Unit division: 0

## 2014-12-26 LAB — FECAL LACTOFERRIN, QUANT: Fecal Lactoferrin: POSITIVE

## 2014-12-26 LAB — COMPREHENSIVE METABOLIC PANEL
ALT: 15 U/L (ref 14–54)
ANION GAP: 8 (ref 5–15)
AST: 31 U/L (ref 15–41)
Albumin: 2.2 g/dL — ABNORMAL LOW (ref 3.5–5.0)
Alkaline Phosphatase: 125 U/L (ref 38–126)
BILIRUBIN TOTAL: 0.4 mg/dL (ref 0.3–1.2)
BUN: 20 mg/dL (ref 6–20)
CO2: 19 mmol/L — AB (ref 22–32)
Calcium: 8.1 mg/dL — ABNORMAL LOW (ref 8.9–10.3)
Chloride: 109 mmol/L (ref 101–111)
Creatinine, Ser: 1.09 mg/dL — ABNORMAL HIGH (ref 0.44–1.00)
GFR calc Af Amer: 52 mL/min — ABNORMAL LOW (ref >60)
GFR calc non Af Amer: 45 mL/min — ABNORMAL LOW (ref >60)
GLUCOSE: 116 mg/dL — AB (ref 65–99)
POTASSIUM: 3 mmol/L — AB (ref 3.5–5.1)
SODIUM: 136 mmol/L (ref 135–145)
TOTAL PROTEIN: 5 g/dL — AB (ref 6.5–8.1)

## 2014-12-26 LAB — LIPASE, BLOOD: Lipase: 20 U/L (ref 11–51)

## 2014-12-26 MED ORDER — ATORVASTATIN CALCIUM 10 MG PO TABS
10.0000 mg | ORAL_TABLET | Freq: Every day | ORAL | Status: DC
  Administered 2014-12-26 – 2014-12-31 (×6): 10 mg via ORAL
  Filled 2014-12-26 (×7): qty 1

## 2014-12-26 MED ORDER — METOPROLOL TARTRATE 25 MG PO TABS
25.0000 mg | ORAL_TABLET | Freq: Two times a day (BID) | ORAL | Status: DC
  Administered 2014-12-26: 25 mg via ORAL
  Filled 2014-12-26 (×4): qty 1

## 2014-12-26 MED ORDER — SACCHAROMYCES BOULARDII 250 MG PO CAPS
250.0000 mg | ORAL_CAPSULE | Freq: Two times a day (BID) | ORAL | Status: DC
  Administered 2014-12-26 – 2015-01-01 (×12): 250 mg via ORAL
  Filled 2014-12-26 (×13): qty 1

## 2014-12-26 MED ORDER — POTASSIUM CHLORIDE CRYS ER 20 MEQ PO TBCR
40.0000 meq | EXTENDED_RELEASE_TABLET | Freq: Once | ORAL | Status: AC
  Administered 2014-12-26: 40 meq via ORAL
  Filled 2014-12-26: qty 2

## 2014-12-26 MED ORDER — DILTIAZEM HCL 30 MG PO TABS
30.0000 mg | ORAL_TABLET | Freq: Four times a day (QID) | ORAL | Status: DC
  Administered 2014-12-27 (×2): 30 mg via ORAL
  Filled 2014-12-26 (×6): qty 1

## 2014-12-26 MED ORDER — LOPERAMIDE HCL 2 MG PO CAPS
2.0000 mg | ORAL_CAPSULE | ORAL | Status: DC | PRN
  Administered 2014-12-26 (×2): 2 mg via ORAL
  Filled 2014-12-26 (×2): qty 1

## 2014-12-26 NOTE — Progress Notes (Signed)
Subjective:  Feels much better, diarrhea has decreased. No chest pain, shortness breath or palpitations.  Objective:  Vital Signs in the last 24 hours: Temp:  [97.7 F (36.5 C)-99.1 F (37.3 C)] 98.7 F (37.1 C) (11/01 1511) Pulse Rate:  [96-143] 115 (11/01 1800) Resp:  [17-30] 18 (11/01 1800) BP: (103-149)/(49-78) 149/65 mmHg (11/01 1800) SpO2:  [93 %-96 %] 94 % (11/01 1800) Weight:  [75 kg (165 lb 5.5 oz)] 75 kg (165 lb 5.5 oz) (11/01 0500)  Intake/Output from previous day: 10/31 0701 - 11/01 0700 In: 2666.5 [P.O.:360; I.V.:1701.5; Blood:355; IV Piggyback:250] Out: 825 [Urine:475; Stool:350]  Physical Exam: General appearance: alert, cooperative, appears stated age, no distress and mildly obese Eyes: negative findings: lids and lashes normal Neck: no adenopathy, no carotid bruit, no JVD, supple, symmetrical, trachea midline and thyroid not enlarged, symmetric, no tenderness/mass/nodules Neck: JVP - normal, carotids 2+= without bruits Resp: Clear Chest wall: no tenderness Cardio: irregular S1 variable, S2 normal, I-II/VI SEM in apex. No click, rub or gallop GI: soft, non-tender; bowel sounds normal; no masses, no organomegaly and Obese Extremities: extremities normal, atraumatic, no cyanosis or edema Pulses: 2+ and symmetric Lab Results: BMP  Recent Labs  12/24/14 0420 12/25/14 0140 12/26/14 0322  NA 137 142 136  K 4.6 3.7 3.0*  CL 110 116* 109  CO2 17* 20* 19*  GLUCOSE 138* 110* 116*  BUN 52* 35* 20  CREATININE 1.66* 1.36* 1.09*  CALCIUM 8.4* 8.4* 8.1*  GFRNONAA 27* 34* 45*  GFRAA 31* 40* 52*    CBC  Recent Labs Lab 12/24/14 0420  12/26/14 0322  WBC 14.8*  < > 11.0*  RBC 2.84*  < > 3.74*  HGB 8.0*  < > 10.1*  HCT 25.4*  < > 31.7*  PLT 357  < > 312  MCV 89.4  < > 84.8  MCH 28.2  < > 27.0  MCHC 31.5  < > 31.9  RDW 12.8  < > 14.8  LYMPHSABS 1.1  --   --   MONOABS 0.7  --   --   EOSABS 0.0  --   --   BASOSABS 0.0  --   --   < > = values in this  interval not displayed.  HEMOGLOBIN A1C Lab Results  Component Value Date   HGBA1C 6.7* 10/10/2014   MPG 146 10/10/2014    Cardiac Panel (last 3 results)  Recent Labs  10/09/14 1541 10/09/14 2004 10/10/14 0140  TROPONINI 0.04* 0.04* 0.04*    TSH  Recent Labs  10/09/14 0803 12/24/14 0020  TSH 1.191 0.718    CHOLESTEROL  Recent Labs  12/24/14 0420  CHOL 99    Hepatic Function Panel  Recent Labs  12/24/14 0420 12/25/14 0140 12/26/14 0322  PROT 5.2* 4.7* 5.0*  ALBUMIN 2.6* 2.2* 2.2*  AST 45* 36 31  ALT ALKPHOS 260* 176* 125  BILITOT 0.3 0.8 0.4    Cardiac Studies:  EKG: A. Fib with RVR  Echo: 12/25/14: Left ventricle: The cavity size was normal. Wall thickness was normal. Systolic function was normal. The estimated ejection fraction was in the range of 55% to 60%. Wall motion was normal; there were no regional wall motion abnormalities. - Aortic valve: Valve area (VTI): 1.52 cm^2. Valve area (Vmax): 1.49 cm^2. Valve area (Vmean): 1.54 cm^2. - Mitral valve: Valve area by continuity equation (using LVOT flow): 1.14 cm^2. - Left atrium: The atrium was mildly dilated. - Pulmonary arteries: Systolic pressure was moderately  increased. PA peak pressure: 43 mm Hg (S). - Pericardium, extracardiac: A trivial pericardial effusion was identified.  Assessment/Plan:  1. A. Fib with rVR CHA2DS2-VASCScore: Risk Score 5, Yearly risk of stroke 6.7%. OAC Has Bled: Score 2. Estimated risk of major bleeding at 1 year with OAC 1.8-3.2% 2. DM new 3. Acute renal failure 4. Acute bacterial enteritis 5. Acute blood loss anemia, probably GI tract from acute enteritis. Anticoagulation on hold presently.  Recommendation: Heart rate is much better controlled, but she still has episodes of RVR. I have added metoprolol to her present medical regimen, increased the dose of Cardizem. Her blood pressure is now stable. I will consider switching to oral  amiodarone in the morning.   Yates DecampGANJI, Aveon Colquhoun, M.D. 12/26/2014, 6:17 PM [facility] Pager: [phone] Office: [phone] If no answer: [phone]

## 2014-12-26 NOTE — Progress Notes (Signed)
[facility] Progress Note  Subjective: Patient states she feels much better today. No abdominal pain or diarrhea. No signs of bleeding.  Objective: Vital signs in last 24 hours: Temp:  [97.7 F (36.5 C)-99.5 F (37.5 C)] 98.4 F (36.9 C) (11/01 0805) Pulse Rate:  [90-143] 102 (11/01 0900) Resp:  [22-35] 26 (11/01 0900) BP: (93-125)/(49-75) 125/63 mmHg (11/01 0900) SpO2:  [92 %-96 %] 96 % (11/01 0900) Weight:  [75 kg (165 lb 5.5 oz)] 75 kg (165 lb 5.5 oz) (11/01 0500) Weight change: 2.4 kg (5 lb 4.7 oz)   PE:  Abdomen soft and nontender  Lab Results: Results for orders placed or performed during the hospital encounter of 12/23/14 (from the past 24 hour(s))  CBC     Status: Abnormal   Collection Time: 12/25/14 11:51 AM  Result Value Ref Range   WBC 11.6 (H) 4.0 - 10.5 K/uL   RBC 3.36 (L) 3.87 - 5.11 MIL/uL   Hemoglobin 9.4 (L) 12.0 - 15.0 g/dL   HCT 40.928.9 (L) 81.136.0 - 91.446.0 %   MCV 86.0 78.0 - 100.0 fL   MCH 28.0 26.0 - 34.0 pg   MCHC 32.5 30.0 - 36.0 g/dL   RDW 78.214.2 95.611.5 - 21.315.5 %   Platelets 311 150 - 400 K/uL  Comprehensive metabolic panel     Status: Abnormal   Collection Time: 12/26/14  3:22 AM  Result Value Ref Range   Sodium 136 135 - 145 mmol/L   Potassium 3.0 (L) 3.5 - 5.1 mmol/L   Chloride 109 101 - 111 mmol/L   CO2 19 (L) 22 - 32 mmol/L   Glucose, Bld 116 (H) 65 - 99 mg/dL   BUN 20 6 - 20 mg/dL   Creatinine, Ser 0.861.09 (H) 0.44 - 1.00 mg/dL   Calcium 8.1 (L) 8.9 - 10.3 mg/dL   Total Protein 5.0 (L) 6.5 - 8.1 g/dL   Albumin 2.2 (L) 3.5 - 5.0 g/dL   AST 31 15 - 41 U/L   ALT 15 14 - 54 U/L   Alkaline Phosphatase 125 38 - 126 U/L   Total Bilirubin 0.4 0.3 - 1.2 mg/dL   GFR calc non Af Amer 45 (L) >60 mL/min   GFR calc Af Amer 52 (L) >60 mL/min   Anion gap 8 5 - 15  Lipase, blood     Status: None   Collection Time: 12/26/14  3:22 AM  Result Value Ref Range   Lipase 20 11 - 51 U/L  CBC     Status: Abnormal   Collection Time: 12/26/14  3:22 AM   Result Value Ref Range   WBC 11.0 (H) 4.0 - 10.5 K/uL   RBC 3.74 (L) 3.87 - 5.11 MIL/uL   Hemoglobin 10.1 (L) 12.0 - 15.0 g/dL   HCT 57.831.7 (L) 46.936.0 - 62.946.0 %   MCV 84.8 78.0 - 100.0 fL   MCH 27.0 26.0 - 34.0 pg   MCHC 31.9 30.0 - 36.0 g/dL   RDW 52.814.8 41.311.5 - 24.415.5 %   Platelets 312 150 - 400 K/uL    Studies/Results: No results found.    Assessment: Gastroenteritis  Anemia  Plan:   In view of hemoglobin be in stable, and no signs of active GI bleeding and given her multiple medical problems including cardiac issues I do not feel that further testing is needed from a GI standpoint. I would recommend observation and following her hemoglobin and hematocrit. We will sign off. Call us if needed.  SAM F Jayel Scaduto 12/26/2014, 11:04 AM  Pager: [phone] If no answer or after 5 PM call [phone] Lab Results  Component Value Date   HGB 10.1* 12/26/2014   HGB 9.4* 12/25/2014   HGB 8.1* 12/25/2014   HCT 31.7* 12/26/2014   HCT 28.9* 12/25/2014   HCT 25.8* 12/25/2014   ALKPHOS 125 12/26/2014   ALKPHOS 176* 12/25/2014   ALKPHOS 260* 12/24/2014   AST 31 12/26/2014   AST 36 12/25/2014   AST 45* 12/24/2014   ALT 15 12/26/2014   ALT 15 12/25/2014   ALT 17 12/24/2014   AMYLASE 535* 12/24/2014   AMYLASE 579* 12/24/2014   AMYLASE 1154* 12/24/2014

## 2014-12-26 NOTE — Progress Notes (Signed)
[facility]                                                                              Patient Demographics  Zenna Traister, is a 81 y.o. female, DOB - 01-Jun-1927, FEO:[id number]  Admit date - 12/23/2014   Admitting Physician Allie Bossier, MD  Outpatient Primary MD for the patient is No primary care provider on file.  LOS - 3   Chief Complaint  Patient presents with  . Abdominal Pain  . Hypotension       Brief HPI   Per Dr. Sherral Hammers admit note on 12/23/14 ENES WEGENER is a 81 y.o. female WF PMHx HTN, HLD, Atrial Fibrillation on Xarelto presented for acute abdominal pain and diarrhea. The patient had been having constipation over the last few days with multiple days without passing a bowel movement until on the day of admission after taking colace. She reported several stools throughout the day. She developed pain in her lower abdomen and started passing large amounts of loose stool. Patient denied any hematochezia or melena.  Patient reported prior colonoscopy more than 5 years ago. Patient reported that she usually eats a in town with her caregiver ~ 3 times a week, however eats mostly the same establishments. States caregiver has not had any similar symptoms.  Assessment & Plan    Principal problem Enterobacter sepsis likely due to acute enteritis, pancreatitis -On admission patient met sepsis criteria elevated WBC, hypothermic, severely elevated lactic acid. - Unfortunately no urine analysis or cultures were sent in the ER,  urine culture after patient had already received antibiotics is now negative.  - Continue IV Rocephin and Flagyl. Will need antibiotics for 2 weeks.   Active problems Enteritis/infectious Colitis/Diarrhea - C. difficile negative, fecal lactoferrin positive, GI pathogen panel pending  - improving, diet advanced to solids today  - Continue IV Rocephin and Flagyl.   Acute on chronic blood loss anemia, GI bleed due to colitis -  Patient had presented with hemoglobin of 11.2 after several episodes of diarrhea, may have had hemoconcentration at that time, was placed on IV fluid hydration. No gross GI bleeding per patient denied any hematochezia or melena. - FOBT was positive, hb trended down to 6.5 on 10/31, patient was transfused 2 units packed RBCs, H&H stable today. - GI was consulted, recommended conservative management, no colonoscopy at this time.   Pancreatitis: Unclear etiology, lipid panel showed no hypertriglyceridemia, has cholecystectomy - No significant abdominal pain, amylase improving, lipase negative - Continue IV fluids, continue clears  Atrial fibrillation with RVR - Hold xarelto, FOBT positive - Rate better controlled today, continue IV amiodarone drip. Flecainide discontinued by cardiology - Cardiology, Dr Einar Gip following  -2-D echo showed EF of 55-60%     Hypotension with history of HTN,  - BP somewhat stable today, continue to hold antihypertensives   Acute kidney injury: Patient presented with creatinine of 1.7, likely prerenal - Likely due to dehydration, diarrhea, #1 #2, Improving - diet advanced, KVO fluids   HLD, -Lipid panel showed LDL 36,  restart statin  Generalized debility - PTOT evaluation  Anxiety - much improved today, continue  Ativan as needed   Code Status: Full CODE STATUS  Family Communication: Discussed in detail with the patient, all imaging results, lab results explained to the patient and son on the phone   Disposition Plan:   Time Spent in minutes   25 minutes  Procedures  CT abdomen  2-D echo  Consults   Gastroenterology   cardiology  DVT Prophylaxis  SCD's  Medications  Scheduled Meds: . cefTRIAXone (ROCEPHIN)  IV  2 g Intravenous Q24H  . clorazepate  7.5 mg Oral BID  . diltiazem  30 mg Oral 3 times per day  . metronidazole  500 mg Intravenous Q8H  . saccharomyces boulardii  250 mg Oral BID   Continuous Infusions: . amiodarone 30 mg/hr  (12/26/14 0800)   PRN Meds:.acetaminophen **OR** acetaminophen, loperamide, LORazepam, oxyCODONE   Antibiotics   Anti-infectives    Start     Dose/Rate Route Frequency Ordered Stop   12/25/14 1815  cefTRIAXone (ROCEPHIN) 2 g in dextrose 5 % 50 mL IVPB     2 g 100 mL/hr over 30 Minutes Intravenous Every 24 hours 12/25/14 1804     12/24/14 1800  ciprofloxacin (CIPRO) IVPB 400 mg  Status:  Discontinued     400 mg 200 mL/hr over 60 Minutes Intravenous Every 24 hours 12/24/14 0412 12/25/14 1804   12/24/14 0600  metroNIDAZOLE (FLAGYL) IVPB 500 mg     500 mg 100 mL/hr over 60 Minutes Intravenous Every 8 hours 12/24/14 0412     12/23/14 1600  ciprofloxacin (CIPRO) IVPB 400 mg     400 mg 200 mL/hr over 60 Minutes Intravenous  Once 12/23/14 1557 12/23/14 2119   12/23/14 1600  metroNIDAZOLE (FLAGYL) IVPB 500 mg     500 mg 100 mL/hr over 60 Minutes Intravenous  Once 12/23/14 1557 12/23/14 2119        Subjective:   Sameka Bagent was seen and examined today. heart rate improving, no nausea, vomiting, diarrhea or GI bleeding.   Patient denies chest pain, shortness of breath, numbess, tingling.  afebrile   Objective:   Blood pressure 125/63, pulse 102, temperature 98.2 F (36.8 C), temperature source Oral, resp. rate 26, height $RemoveBe'5\' 3"'eboxhxEkO$  (1.6 m), weight 75 kg (165 lb 5.5 oz), SpO2 96 %.  Wt Readings from Last 3 Encounters:  12/26/14 75 kg (165 lb 5.5 oz)  10/23/14 69.31 kg (152 lb 12.8 oz)  10/09/14 70.308 kg (155 lb)     Intake/Output Summary (Last 24 hours) at 12/26/14 1207 Last data filed at 12/26/14 8867  Gross per 24 hour  Intake 2311.48 ml  Output    475 ml  Net 1836.48 ml    Exam  General: Alert and oriented x 3, NAD, anxious   HEENT:  PERRLA, EOMI  Neck: Supple, no JVD, no masses  CVS: S1 S2 clear, RRR  Respiratory: CTAB  Abdomen: Soft, NT, ND  Ext: no cyanosis clubbing or edema  Neuro: no new deficits   Skin: No rashes  Psych:  anxious , alert and  oriented x3    Data Review   Micro Results Recent Results (from the past 240 hour(s))  C difficile quick scan w PCR reflex     Status: None   Collection Time: 12/23/14  4:50 PM  Result Value Ref Range Status   C Diff antigen NEGATIVE NEGATIVE Final   C Diff toxin NEGATIVE NEGATIVE Final   C Diff interpretation Negative for toxigenic C. difficile  Final  Blood culture (routine x 2)  Status: None   Collection Time: 12/23/14  6:05 PM  Result Value Ref Range Status   Specimen Description BLOOD LEFT ANTECUBITAL  Final   Special Requests BOTTLES DRAWN AEROBIC AND ANAEROBIC 5CC  Final   Culture  Setup Time   Final    GRAM NEGATIVE RODS AEROBIC BOTTLE ONLY CRITICAL RESULT CALLED TO, READ BACK BY AND VERIFIED WITH: D DILLON,RN AT 6301 12/24/14 BY L BENFIELD    Culture ENTEROBACTER AEROGENES  Final   Report Status 12/26/2014 FINAL  Final   Organism ID, Bacteria ENTEROBACTER AEROGENES  Final      Susceptibility   Enterobacter aerogenes - MIC*    CEFAZOLIN >=64 RESISTANT Resistant     CEFEPIME <=1 SENSITIVE Sensitive     CEFTAZIDIME <=1 SENSITIVE Sensitive     CEFTRIAXONE <=1 SENSITIVE Sensitive     CIPROFLOXACIN <=0.25 SENSITIVE Sensitive     GENTAMICIN <=1 SENSITIVE Sensitive     IMIPENEM 1 SENSITIVE Sensitive     TRIMETH/SULFA <=20 SENSITIVE Sensitive     PIP/TAZO <=4 SENSITIVE Sensitive     * ENTEROBACTER AEROGENES  Blood culture (routine x 2)     Status: None (Preliminary result)   Collection Time: 12/23/14  6:19 PM  Result Value Ref Range Status   Specimen Description BLOOD RIGHT HAND  Final   Special Requests BOTTLES DRAWN AEROBIC ONLY 2CC  Final   Culture NO GROWTH 2 DAYS  Final   Report Status PENDING  Incomplete  MRSA PCR Screening     Status: None   Collection Time: 12/24/14 12:00 AM  Result Value Ref Range Status   MRSA by PCR NEGATIVE NEGATIVE Final    Comment:        The GeneXpert MRSA Assay (FDA approved for NASAL specimens only), is one component of  a comprehensive MRSA colonization surveillance program. It is not intended to diagnose MRSA infection nor to guide or monitor treatment for MRSA infections.   Culture, Urine     Status: None   Collection Time: 12/24/14  5:30 AM  Result Value Ref Range Status   Specimen Description URINE, CLEAN CATCH  Final   Special Requests NONE  Final   Culture MULTIPLE SPECIES PRESENT, SUGGEST RECOLLECTION  Final   Report Status 12/25/2014 FINAL  Final  Stool culture     Status: None (Preliminary result)   Collection Time: 12/24/14  8:27 AM  Result Value Ref Range Status   Specimen Description STOOL  Final   Special Requests NONE  Final   Culture   Final    NO SUSPICIOUS COLONIES, CONTINUING TO HOLD Note: REDUCED NORMAL FLORA PRESENT Performed at [facility]    Report Status PENDING  Incomplete    Radiology Reports Ct Abdomen Pelvis Wo Contrast  12/23/2014  CLINICAL DATA:  Lower abdominal pain.  Constipation for 3 days EXAM: CT ABDOMEN AND PELVIS WITHOUT CONTRAST TECHNIQUE: Multidetector CT imaging of the abdomen and pelvis was performed following the standard protocol without IV contrast. COMPARISON:  None. FINDINGS: Lower chest: Lung bases are clear. Hepatobiliary: No focal hepatic lesion. Gallbladder is not identified. No biliary duct dilatation Pancreas: Pancreas is normal. No ductal dilatation. No pancreatic inflammation. Spleen: Normal spleen Adrenals/urinary tract: Adrenal glands and kidneys are normal. The ureters and bladder normal. Stomach/Bowel: Stomach is mildly distended with fluid. There is fluid in the duodenum. The proximal small bowel is normal caliber. The distal small bowel is likewise normal caliber. The cecum is fluid-filled. Appendix not identified. Transverse colon is normal caliber.  There is fluid stool in the descending colon.several diverticula of the sigmoid colon without acute inflammation. Rectum is normal. Vascular/Lymphatic: Abdominal aorta is normal caliber  with atherosclerotic calcification. There is no retroperitoneal or periportal lymphadenopathy. No pelvic lymphadenopathy. Reproductive: Post hysterectomy. Musculoskeletal: Severe degenerative change of the lumbar spine without acute findings Other: No free fluid in the abdomen or pelvis. IMPRESSION: 1. Fluid stool throughout the colon suggests diarrheal disease. 2. No clear evidence of bowel obstruction. No oral contrast administered. 3. Severe atherosclerotic calcification of the aorta. 4. Severe degenerative changes lumbar spine. Electronically Signed   By: Suzy Bouchard M.D.   On: 12/23/2014 15:34    CBC  Recent Labs Lab 12/23/14 1312  12/24/14 0420  12/24/14 2015 12/25/14 0140 12/25/14 0756 12/25/14 1151 12/26/14 0322  WBC 22.3*  --  14.8*  --   --  10.0  --  11.6* 11.0*  HGB 11.1*  < > 8.0*  < > 7.0* 6.5* 8.1* 9.4* 10.1*  HCT 36.9  < > 25.4*  --   --  20.7* 25.8* 28.9* 31.7*  PLT 633*  --  357  --   --  311  --  311 312  MCV 90.9  --  89.4  --   --  87.0  --  86.0 84.8  MCH 27.3  --  28.2  --   --  27.3  --  28.0 27.0  MCHC 30.1  --  31.5  --   --  31.4  --  32.5 31.9  RDW 12.6  --  12.8  --   --  12.9  --  14.2 14.8  LYMPHSABS 2.7  --  1.1  --   --   --   --   --   --   MONOABS 0.4  --  0.7  --   --   --   --   --   --   EOSABS 0.1  --  0.0  --   --   --   --   --   --   BASOSABS 0.0  --  0.0  --   --   --   --   --   --   < > = values in this interval not displayed.  Chemistries   Recent Labs Lab 12/23/14 1312 12/24/14 0020 12/24/14 0420 12/25/14 0140 12/26/14 0322  NA 137  --  137 142 136  K 4.4  --  4.6 3.7 3.0*  CL 104  --  110 116* 109  CO2 17*  --  17* 20* 19*  GLUCOSE 161*  --  138* 110* 116*  BUN 49*  --  52* 35* 20  CREATININE 1.72*  --  1.66* 1.36* 1.09*  CALCIUM 10.4*  --  8.4* 8.4* 8.1*  MG  --  2.4  --   --   --   AST 60*  --  45* 36 31  ALT 17  --  $R'17 15 15  'Zr$ ALKPHOS 388*  --  260* 176* 125  BILITOT 0.4  --  0.3 0.8 0.4    ------------------------------------------------------------------------------------------------------------------ estimated creatinine clearance is 36.6 mL/min (by C-G formula based on Cr of 1.09). ------------------------------------------------------------------------------------------------------------------ No results for input(s): HGBA1C in the last 72 hours. ------------------------------------------------------------------------------------------------------------------  Recent Labs  12/24/14 0420  CHOL 99  HDL 46  LDLCALC 36  TRIG 84  CHOLHDL 2.2   ------------------------------------------------------------------------------------------------------------------  Recent Labs  12/24/14 0020  TSH 0.718   ------------------------------------------------------------------------------------------------------------------ No results for input(s):  VITAMINB12, FOLATE, FERRITIN, TIBC, IRON, RETICCTPCT in the last 72 hours.  Coagulation profile  Recent Labs Lab 12/23/14 1312  INR 1.63*    No results for input(s): DDIMER in the last 72 hours.  Cardiac Enzymes No results for input(s): CKMB, TROPONINI, MYOGLOBIN in the last 168 hours.  Invalid input(s): CK ------------------------------------------------------------------------------------------------------------------ Invalid input(s): POCBNP  No results for input(s): GLUCAP in the last 72 hours.   Emilee Market M.D. [facility] 12/26/2014, 12:07 PM  Pager: [phone] Between 7am to 7pm - call Pager - [phone]  After 7pm go to [email or web address] - password [email or web address]  Call night coverage person covering after 7pm

## 2014-12-27 LAB — COMPREHENSIVE METABOLIC PANEL
ALT: 14 U/L (ref 14–54)
ANION GAP: 7 (ref 5–15)
AST: 24 U/L (ref 15–41)
Albumin: 2.1 g/dL — ABNORMAL LOW (ref 3.5–5.0)
Alkaline Phosphatase: 96 U/L (ref 38–126)
BILIRUBIN TOTAL: 0.5 mg/dL (ref 0.3–1.2)
BUN: 18 mg/dL (ref 6–20)
CHLORIDE: 107 mmol/L (ref 101–111)
CO2: 20 mmol/L — ABNORMAL LOW (ref 22–32)
Calcium: 8.6 mg/dL — ABNORMAL LOW (ref 8.9–10.3)
Creatinine, Ser: 0.95 mg/dL (ref 0.44–1.00)
GFR calc Af Amer: >60 mL/min (ref >60)
GFR, EST NON AFRICAN AMERICAN: 53 mL/min — AB (ref >60)
Glucose, Bld: 119 mg/dL — ABNORMAL HIGH (ref 65–99)
POTASSIUM: 3.3 mmol/L — AB (ref 3.5–5.1)
Sodium: 134 mmol/L — ABNORMAL LOW (ref 135–145)
TOTAL PROTEIN: 5 g/dL — AB (ref 6.5–8.1)

## 2014-12-27 LAB — GI PATHOGEN PANEL BY PCR, STOOL
C difficile toxin A/B: NOT DETECTED
CAMPYLOBACTER BY PCR: NOT DETECTED
Cryptosporidium by PCR: NOT DETECTED
E COLI (ETEC) LT/ST: NOT DETECTED
E coli (STEC): NOT DETECTED
E coli 0157 by PCR: NOT DETECTED
G LAMBLIA BY PCR: NOT DETECTED
NOROVIRUS G1/G2: NOT DETECTED
Rotavirus A by PCR: NOT DETECTED
SALMONELLA BY PCR: NOT DETECTED
Shigella by PCR: NOT DETECTED

## 2014-12-27 LAB — CBC
HEMATOCRIT: 30.9 % — AB (ref 36.0–46.0)
HEMOGLOBIN: 10 g/dL — AB (ref 12.0–15.0)
MCH: 27.3 pg (ref 26.0–34.0)
MCHC: 32.4 g/dL (ref 30.0–36.0)
MCV: 84.4 fL (ref 78.0–100.0)
Platelets: 327 10*3/uL (ref 150–400)
RBC: 3.66 MIL/uL — AB (ref 3.87–5.11)
RDW: 14.8 % (ref 11.5–15.5)
WBC: 11.1 10*3/uL — AB (ref 4.0–10.5)

## 2014-12-27 LAB — URINALYSIS, ROUTINE W REFLEX MICROSCOPIC
BILIRUBIN URINE: NEGATIVE
Glucose, UA: NEGATIVE mg/dL
HGB URINE DIPSTICK: NEGATIVE
KETONES UR: NEGATIVE mg/dL
Leukocytes, UA: NEGATIVE
NITRITE: NEGATIVE
Protein, ur: NEGATIVE mg/dL
Specific Gravity, Urine: 1.02 (ref 1.005–1.030)
UROBILINOGEN UA: 0.2 mg/dL (ref 0.0–1.0)
pH: 5.5 (ref 5.0–8.0)

## 2014-12-27 MED ORDER — DILTIAZEM HCL ER COATED BEADS 180 MG PO CP24
180.0000 mg | ORAL_CAPSULE | Freq: Every day | ORAL | Status: DC
  Administered 2014-12-27 – 2015-01-01 (×6): 180 mg via ORAL
  Filled 2014-12-27 (×6): qty 1

## 2014-12-27 MED ORDER — POTASSIUM CHLORIDE CRYS ER 20 MEQ PO TBCR
40.0000 meq | EXTENDED_RELEASE_TABLET | Freq: Once | ORAL | Status: AC
  Administered 2014-12-27: 40 meq via ORAL
  Filled 2014-12-27: qty 2

## 2014-12-27 MED ORDER — METOPROLOL TARTRATE 25 MG PO TABS
25.0000 mg | ORAL_TABLET | Freq: Four times a day (QID) | ORAL | Status: DC
  Administered 2014-12-27 (×4): 25 mg via ORAL
  Filled 2014-12-27 (×5): qty 1

## 2014-12-27 NOTE — Progress Notes (Signed)
Paged Dr. Isidoro Donningai to inform her that Dr. Jacinto HalimGanji is ok for pt to transfer. Awaiting new orders.

## 2014-12-27 NOTE — Progress Notes (Addendum)
Subjective:  Feels much better although very weak, diarrhea has resolved since yesterday and feels mildly constipated. No chest pain, shortness breath or palpitations.  Objective:  Vital Signs in the last 24 hours: Temp:  [98.1 F (36.7 C)-99.1 F (37.3 C)] 98.6 F (37 C) (11/02 0748) Pulse Rate:  [99-120] 99 (11/02 0400) Resp:  [17-27] 24 (11/02 0400) BP: (105-149)/(46-99) 129/82 mmHg (11/02 0400) SpO2:  [92 %-96 %] 96 % (11/02 0400)  Intake/Output from previous day: 11/01 0701 - 11/02 0700 In: 1107 [P.O.:840; I.V.:167; IV Piggyback:100] Out: 325 [Urine:325]  Physical Exam: General appearance: alert, cooperative, appears stated age, no distress and mildly obese Eyes: negative findings: lids and lashes normal Neck: no adenopathy, no carotid bruit, no JVD, supple, symmetrical, trachea midline and thyroid not enlarged, symmetric, no tenderness/mass/nodules Resp: Clear Chest wall: no tenderness Cardio: irregular S1 variable, S2 normal, I-II/VI SEM in apex. No click, rub or gallop GI: soft, non-tender; bowel sounds normal; no masses, no organomegaly. Obese Extremities: extremities normal, atraumatic, no cyanosis. 1+ lower extremity edema Pulses: 2+ and symmetric  Lab Results: BMP  Recent Labs  12/25/14 0140 12/26/14 0322 12/27/14 0354  NA 142 136 134*  K 3.7 3.0* 3.3*  CL 116* 109 107  CO2 20* 19* 20*  GLUCOSE 110* 116* 119*  BUN 35* 20 18  CREATININE 1.36* 1.09* 0.95  CALCIUM 8.4* 8.1* 8.6*  GFRNONAA 34* 45* 53*  GFRAA 40* 52* >60    CBC  Recent Labs Lab 12/24/14 0420  12/27/14 0354  WBC 14.8*  < > 11.1*  RBC 2.84*  < > 3.66*  HGB 8.0*  < > 10.0*  HCT 25.4*  < > 30.9*  PLT 357  < > 327  MCV 89.4  < > 84.4  MCH 28.2  < > 27.3  MCHC 31.5  < > 32.4  RDW 12.8  < > 14.8  LYMPHSABS 1.1  --   --   MONOABS 0.7  --   --   EOSABS 0.0  --   --   BASOSABS 0.0  --   --   < > = values in this interval not displayed.  HEMOGLOBIN A1C Lab Results  Component Value  Date   HGBA1C 6.7* 10/10/2014   MPG 146 10/10/2014    Cardiac Panel (last 3 results)  Recent Labs  10/09/14 1541 10/09/14 2004 10/10/14 0140  TROPONINI 0.04* 0.04* 0.04*    TSH  Recent Labs  10/09/14 0803 12/24/14 0020  TSH 1.191 0.718    Recent Labs  12/25/14 0140 12/26/14 0322 12/27/14 0354  PROT 4.7* 5.0* 5.0*  ALBUMIN 2.2* 2.2* 2.1*  AST 36 31 24  ALT ALKPHOS 176* 125 96  BILITOT 0.8 0.4 0.5    Cardiac Studies:  EKG: A. Fib with RVR  Echo 12/25/14:  Left ventricle: The cavity size was normal. Wall thickness was normal. Systolic function was normal. The estimated ejection fraction was in the range of 55% to 60%. Wall motion was normal; there were no regional wall motion abnormalities. Aortic valve: Valve area (VTI): 1.52 cm^2. Valve area (Vmax): 1.49 cm^2. Valve area (Vmean): 1.54 cm^2. Mitral valve: Valve area by continuity equation (using LVOT flow): 1.14 cm^2. Left atrium: The atrium was mildly dilated. Pulmonary arteries: Systolic pressure was moderately increased. PA peak pressure: 43 mm Hg (S). Pericardium, extracardiac: A trivial pericardial effusion wasidentified.  Assessment/Plan:  1. A. Fib with rVR CHA2DS2-VASCScore: Risk Score 5, Yearly risk of stroke 6.7%. OAC Has Bled:  Score 2. Estimated risk of major bleeding at 1 year with OAC 1.8-3.2% 2. DM new 3. Acute renal failure 4. Acute bacterial enteritis 5. Acute blood loss anemia, probably GI tract from acute enteritis. Anticoagulation on hold presently. 6. Deconditioning.  Recommendation: Heart rate is much better controlled, remains at 100-120.  No further episodes of diarrhea. Blood pressure stable.  Transition amiodarone gtt to PO.  Will increase betablocker and I think we could potentially restart angticoagulation as her hemoglobin is stable. Will probably need SNF and PT OT consult. Up in chair and okay to transfer out. Discontinue Loperamide.   Yates DecampGANJI, Gavyn Zoss, MD 12/27/2014,  9:10 AM [facility] [place] Pager: [phone] Office: [phone] If no answer: Cell:  [phone]

## 2014-12-27 NOTE — Progress Notes (Signed)
Patient arrived to unit. Alert and oriented. Family at bedside. No questions or concerns at this time.

## 2014-12-27 NOTE — Progress Notes (Signed)
Report called to RN on 2W. Family at bedside and aware of pt transfer.

## 2014-12-27 NOTE — Progress Notes (Signed)
Physical Therapy Treatment Patient Details Name: Penny Ramos MRN: [id number] DOB: 05/24/1927 Today's Date: 12/27/2014    History of Present Illness Pt is a 81 y/o F who presents w/ enteritis w/ PMH of a-fib, back and ankle surgery.      PT Comments    Penny Ramos made modest progress today, ambulating 6 ft today, HR up to 130s.  She became anxious 2/2 her generalized weakness in Bil LEs.  Encouraged pt to ambulate in room w/ nursing staff 1-2 more times today.  Pt will benefit from continued skilled PT services to increase functional independence and safety.   Follow Up Recommendations  SNF;Supervision for mobility/OOB     Equipment Recommendations  None recommended by PT    Recommendations for Other Services       Precautions / Restrictions Precautions Precautions: Fall Restrictions Weight Bearing Restrictions: No    Mobility  Bed Mobility Overal bed mobility: Needs Assistance Bed Mobility: Rolling;Sidelying to Sit Rolling: Independent Sidelying to sit: Mod assist;HOB elevated       General bed mobility comments: Mod assist to trunk to achieve sidelying to sit.  Pt attempts to push up from sidelying but is not able.  Pt educated on proper breathing techniques as she demonstrates valsalva maneuver.  Transfers Overall transfer level: Needs assistance Equipment used: Rolling walker (2 wheeled) Transfers: Sit to/from Stand Sit to Stand: Min assist         General transfer comment: Min assist to stedy pt and RW.  Cues for hand placement.  Pt has uncontrolled descent to recliner chair after ambulating 2/2 Bil LE weakness.  Ambulation/Gait Ambulation/Gait assistance: Min assist Ambulation Distance (Feet): 6 Feet Assistive device: Rolling walker (2 wheeled) Gait Pattern/deviations: Step-through pattern;Decreased stride length;Shuffle;Antalgic   Gait velocity interpretation: <1.8 ft/sec, indicative of risk for recurrent falls General Gait Details: Min assist to  stedy pt and to manage RW.  Pt becomes very anxious about her Bil LE weakness after taking a few steps and declines ambulating farther.   Stairs            Wheelchair Mobility    Modified Rankin (Stroke Patients Only)       Balance Overall balance assessment: Needs assistance;History of Falls Sitting-balance support: Bilateral upper extremity supported;Feet supported Sitting balance-Leahy Scale: Fair Sitting balance - Comments: Close min guard for pt's safety   Standing balance support: Bilateral upper extremity supported;During functional activity Standing balance-Leahy Scale: Poor Standing balance comment: Relies on RW for support                    Cognition Arousal/Alertness: Awake/alert Behavior During Therapy: Anxious Overall Cognitive Status: Within Functional Limits for tasks assessed                      Exercises General Exercises - Lower Extremity Ankle Circles/Pumps: AROM;Both;10 reps;Seated Long Arc Quad: AROM;Both;10 reps;Seated Hip Flexion/Marching: AROM;Both;10 reps;Seated    General Comments General comments (skin integrity, edema, etc.): Pt's HR up to 130's, otherwise VSS.       Pertinent Vitals/Pain Pain Assessment: No/denies pain    Home Living                      Prior Function            PT Goals (current goals can now be found in the care plan section) Acute Rehab PT Goals Patient Stated Goal: to go to rehab before home PT Goal Formulation: With  patient Time For Goal Achievement: 01/15/15 Potential to Achieve Goals: Good Progress towards PT goals: Progressing toward goals    Frequency  Min 3X/week    PT Plan Current plan remains appropriate    Co-evaluation             End of Session Equipment Utilized During Treatment: Gait belt Activity Tolerance: Patient limited by fatigue;Other (comment) (anxiety) Patient left: in chair;with call bell/phone within reach     Time: 1046-1106 PT Time  Calculation (min) (ACUTE ONLY): 20 min  Charges:  $Therapeutic Activity: 8-22 mins                    G CodesMichail Jewels PT, [place] [phone] Pager: [phone] 12/27/2014, 12:18 PM

## 2014-12-27 NOTE — Progress Notes (Signed)
[facility]                                                                              Patient Demographics  Penny Ramos, is a 81 y.o. female, DOB - Jun 17, 1927, OKH:[id number]  Admit date - 12/23/2014   Admitting Physician Allie Bossier, MD  Outpatient Primary MD for the patient is No primary care provider on file.  LOS - 4   Chief Complaint  Patient presents with  . Abdominal Pain  . Hypotension       Brief HPI   Per Dr. Sherral Hammers admit note on 12/23/14 Penny Ramos is a 81 y.o. female WF PMHx HTN, HLD, Atrial Fibrillation on Xarelto presented for acute abdominal pain and diarrhea. The patient had been having constipation over the last few days with multiple days without passing a bowel movement until on the day of admission after taking colace. She reported several stools throughout the day. She developed pain in her lower abdomen and started passing large amounts of loose stool. Patient denied any hematochezia or melena.  Patient reported prior colonoscopy more than 5 years ago. Patient reported that she usually eats a in town with her caregiver ~ 3 times a week, however eats mostly the same establishments. States caregiver has not had any similar symptoms.  Assessment & Plan    Principal problem Enterobacter sepsis likely due to acute enteritis, pancreatitis -On admission patient met sepsis criteria elevated WBC, hypothermic, severely elevated lactic acid. - Unfortunately no urine analysis or cultures were sent in the ER,  urine culture after patient had already received antibiotics is now negative.  - Continue IV Rocephin and Flagyl. Will need antibiotics for 2 weeks due to bacteremia.   Active problems Enteritis/infectious Colitis/Diarrhea- improved - C. difficile negative, fecal lactoferrin positive, GI pathogen panel pending  - tolerating solids - Continue IV Rocephin and Flagyl.   Acute on chronic blood loss anemia, GI bleed due to colitis -  Patient had presented with hemoglobin of 11.2 after several episodes of diarrhea, may have had hemoconcentration at that time, was placed on IV fluid hydration. No gross GI bleeding per patient denied any hematochezia or melena. - FOBT was positive, hb trended down to 6.5 on 10/31, patient was transfused 2 units packed RBCs, H&H stable today. - GI was consulted, recommended conservative management, no colonoscopy at this time.   Pancreatitis: Unclear etiology, lipid panel showed no hypertriglyceridemia, has cholecystectomy - No significant abdominal pain, amylase improving, lipase negative - tolerating solid diet without any difficulty  Atrial fibrillation with RVR- rate now much better controlled -  Rate better controlled today, on beta blocker and IV amiodarone drip. Flecainide discontinued by cardiology - 2-D echo showed EF of 55-60%    - Anticoagulation per cardiology, currently xarelto on hold   Hypotension with history of HTN,  - BP much improved, on beta blocker  Acute kidney injury: Patient presented with creatinine of 1.7, likely prerenal - Likely due to dehydration, diarrhea, #1 #2, resolved - diet advanced, KVO fluids   Hypokalemia Replaced  HLD, -Lipid panel showed LDL 36, on statin  Generalized debility - PTOT evaluation  again today, increase ambulation and activity  Anxiety - much improved, continue Ativan as needed   Code Status: Full CODE STATUS  Family Communication: Discussed in detail with the patient, all imaging results, lab results explained to the patient and son on the phone   Disposition Plan: Once off amiodarone drip, will transfer to telemetry  Time Spent in minutes   25 minutes  Procedures  CT abdomen  2-D echo  Consults   Gastroenterology   cardiology  DVT Prophylaxis  SCD's  Medications  Scheduled Meds: . atorvastatin  10 mg Oral q1800  . cefTRIAXone (ROCEPHIN)  IV  2 g Intravenous Q24H  . clorazepate  7.5 mg Oral BID  .  diltiazem  180 mg Oral Daily  . metoprolol tartrate  25 mg Oral QID  . metronidazole  500 mg Intravenous Q8H  . saccharomyces boulardii  250 mg Oral BID   Continuous Infusions: . amiodarone 30 mg/hr (12/27/14 0607)   PRN Meds:.acetaminophen **OR** acetaminophen, LORazepam, oxyCODONE   Antibiotics   Anti-infectives    Start     Dose/Rate Route Frequency Ordered Stop   12/25/14 1815  cefTRIAXone (ROCEPHIN) 2 g in dextrose 5 % 50 mL IVPB     2 g 100 mL/hr over 30 Minutes Intravenous Every 24 hours 12/25/14 1804     12/24/14 1800  ciprofloxacin (CIPRO) IVPB 400 mg  Status:  Discontinued     400 mg 200 mL/hr over 60 Minutes Intravenous Every 24 hours 12/24/14 0412 12/25/14 1804   12/24/14 0600  metroNIDAZOLE (FLAGYL) IVPB 500 mg     500 mg 100 mL/hr over 60 Minutes Intravenous Every 8 hours 12/24/14 0412     12/23/14 1600  ciprofloxacin (CIPRO) IVPB 400 mg     400 mg 200 mL/hr over 60 Minutes Intravenous  Once 12/23/14 1557 12/23/14 2119   12/23/14 1600  metroNIDAZOLE (FLAGYL) IVPB 500 mg     500 mg 100 mL/hr over 60 Minutes Intravenous  Once 12/23/14 1557 12/23/14 2119        Subjective:   Penny Ramos was seen and examined today. Feels a lot better, no diarrhea, fevers or chills, abdominal pain. No bleeding. Afebrile.Patient denies chest pain, shortness of breath, numbess, tingling. Heart rate improving.  Objective:   Blood pressure 118/75, pulse 106, temperature 98.6 F (37 C), temperature source Oral, resp. rate 27, height _0  (1.6 m), weight 75 kg (165 lb 5.5 oz), SpO2 95 %.  Wt Readings from Last 3 Encounters:  12/26/14 75 kg (165 lb 5.5 oz)  10/23/14 69.31 kg (152 lb 12.8 oz)  10/09/14 70.308 kg (155 lb)     Intake/Output Summary (Last 24 hours) at 12/27/14 1028 Last data filed at 12/27/14 0939  Gross per 24 hour  Intake  780.4 ml  Output    625 ml  Net  155.4 ml    Exam  General: Alert and oriented x 3, NAD, anxious   HEENT:  PERRLA, EOMI  Neck:  Supple, no JVD, no masses  CVS: S1 S2 clear, tachycardia, irregularly irregular  Respiratory: CTAB  Abdomen: Soft, NT, ND  Ext: no cyanosis clubbing or edema  Neuro: no new deficits   Skin: No rashes  Psych:  anxious , alert and oriented x3    Data Review   Micro Results Recent Results (from the past 240 hour(s))  C difficile quick scan w PCR reflex     Status: None   Collection Time: 12/23/14  4:50 PM  Result Value Ref  Range Status   C Diff antigen NEGATIVE NEGATIVE Final   C Diff toxin NEGATIVE NEGATIVE Final   C Diff interpretation Negative for toxigenic C. difficile  Final  Blood culture (routine x 2)     Status: None   Collection Time: 12/23/14  6:05 PM  Result Value Ref Range Status   Specimen Description BLOOD LEFT ANTECUBITAL  Final   Special Requests BOTTLES DRAWN AEROBIC AND ANAEROBIC 5CC  Final   Culture  Setup Time   Final    GRAM NEGATIVE RODS AEROBIC BOTTLE ONLY CRITICAL RESULT CALLED TO, READ BACK BY AND VERIFIED WITH: D DILLON,RN AT 1572 12/24/14 BY L BENFIELD    Culture ENTEROBACTER AEROGENES  Final   Report Status 12/26/2014 FINAL  Final   Organism ID, Bacteria ENTEROBACTER AEROGENES  Final      Susceptibility   Enterobacter aerogenes - MIC*    CEFAZOLIN >=64 RESISTANT Resistant     CEFEPIME <=1 SENSITIVE Sensitive     CEFTAZIDIME <=1 SENSITIVE Sensitive     CEFTRIAXONE <=1 SENSITIVE Sensitive     CIPROFLOXACIN <=0.25 SENSITIVE Sensitive     GENTAMICIN <=1 SENSITIVE Sensitive     IMIPENEM 1 SENSITIVE Sensitive     TRIMETH/SULFA <=20 SENSITIVE Sensitive     PIP/TAZO <=4 SENSITIVE Sensitive     * ENTEROBACTER AEROGENES  Blood culture (routine x 2)     Status: None (Preliminary result)   Collection Time: 12/23/14  6:19 PM  Result Value Ref Range Status   Specimen Description BLOOD RIGHT HAND  Final   Special Requests BOTTLES DRAWN AEROBIC ONLY 2CC  Final   Culture NO GROWTH 3 DAYS  Final   Report Status PENDING  Incomplete  MRSA PCR Screening      Status: None   Collection Time: 12/24/14 12:00 AM  Result Value Ref Range Status   MRSA by PCR NEGATIVE NEGATIVE Final    Comment:        The GeneXpert MRSA Assay (FDA approved for NASAL specimens only), is one component of a comprehensive MRSA colonization surveillance program. It is not intended to diagnose MRSA infection nor to guide or monitor treatment for MRSA infections.   Culture, Urine     Status: None   Collection Time: 12/24/14  5:30 AM  Result Value Ref Range Status   Specimen Description URINE, CLEAN CATCH  Final   Special Requests NONE  Final   Culture MULTIPLE SPECIES PRESENT, SUGGEST RECOLLECTION  Final   Report Status 12/25/2014 FINAL  Final  Stool culture     Status: None (Preliminary result)   Collection Time: 12/24/14  8:27 AM  Result Value Ref Range Status   Specimen Description STOOL  Final   Special Requests NONE  Final   Culture   Final    NO SUSPICIOUS COLONIES, CONTINUING TO HOLD Note: REDUCED NORMAL FLORA PRESENT Performed at [facility]    Report Status PENDING  Incomplete    Radiology Reports Ct Abdomen Pelvis Wo Contrast  12/23/2014  CLINICAL DATA:  Lower abdominal pain.  Constipation for 3 days EXAM: CT ABDOMEN AND PELVIS WITHOUT CONTRAST TECHNIQUE: Multidetector CT imaging of the abdomen and pelvis was performed following the standard protocol without IV contrast. COMPARISON:  None. FINDINGS: Lower chest: Lung bases are clear. Hepatobiliary: No focal hepatic lesion. Gallbladder is not identified. No biliary duct dilatation Pancreas: Pancreas is normal. No ductal dilatation. No pancreatic inflammation. Spleen: Normal spleen Adrenals/urinary tract: Adrenal glands and kidneys are normal. The ureters and bladder normal.  Stomach/Bowel: Stomach is mildly distended with fluid. There is fluid in the duodenum. The proximal small bowel is normal caliber. The distal small bowel is likewise normal caliber. The cecum is fluid-filled. Appendix not  identified. Transverse colon is normal caliber. There is fluid stool in the descending colon.several diverticula of the sigmoid colon without acute inflammation. Rectum is normal. Vascular/Lymphatic: Abdominal aorta is normal caliber with atherosclerotic calcification. There is no retroperitoneal or periportal lymphadenopathy. No pelvic lymphadenopathy. Reproductive: Post hysterectomy. Musculoskeletal: Severe degenerative change of the lumbar spine without acute findings Other: No free fluid in the abdomen or pelvis. IMPRESSION: 1. Fluid stool throughout the colon suggests diarrheal disease. 2. No clear evidence of bowel obstruction. No oral contrast administered. 3. Severe atherosclerotic calcification of the aorta. 4. Severe degenerative changes lumbar spine. Electronically Signed   By: Suzy Bouchard M.D.   On: 12/23/2014 15:34    CBC  Recent Labs Lab 12/23/14 1312  12/24/14 0420  12/25/14 0140 12/25/14 0756 12/25/14 1151 12/26/14 0322 12/27/14 0354  WBC 22.3*  --  14.8*  --  10.0  --  11.6* 11.0* 11.1*  HGB 11.1*  < > 8.0*  < > 6.5* 8.1* 9.4* 10.1* 10.0*  HCT 36.9  < > 25.4*  --  20.7* 25.8* 28.9* 31.7* 30.9*  PLT 633*  --  357  --  311  --  311 312 327  MCV 90.9  --  89.4  --  87.0  --  86.0 84.8 84.4  MCH 27.3  --  28.2  --  27.3  --  28.0 27.0 27.3  MCHC 30.1  --  31.5  --  31.4  --  32.5 31.9 32.4  RDW 12.6  --  12.8  --  12.9  --  14.2 14.8 14.8  LYMPHSABS 2.7  --  1.1  --   --   --   --   --   --   MONOABS 0.4  --  0.7  --   --   --   --   --   --   EOSABS 0.1  --  0.0  --   --   --   --   --   --   BASOSABS 0.0  --  0.0  --   --   --   --   --   --   < > = values in this interval not displayed.  Chemistries   Recent Labs Lab 12/23/14 1312 12/24/14 0020 12/24/14 0420 12/25/14 0140 12/26/14 0322 12/27/14 0354  NA 137  --  137 142 136 134*  K 4.4  --  4.6 3.7 3.0* 3.3*  CL 104  --  110 116* 109 107  CO2 17*  --  17* 20* 19* 20*  GLUCOSE 161*  --  138* 110* 116*  119*  BUN 49*  --  52* 35* 20 18  CREATININE 1.72*  --  1.66* 1.36* 1.09* 0.95  CALCIUM 10.4*  --  8.4* 8.4* 8.1* 8.6*  MG  --  2.4  --   --   --   --   AST 60*  --  45* 36 31 24  ALT 17  --  _0 ALKPHOS 388*  --  260* 176* 125 96  BILITOT 0.4  --  0.3 0.8 0.4 0.5   ------------------------------------------------------------------------------------------------------------------ estimated creatinine clearance is 42 mL/min (by C-G formula based on Cr of 0.95). ------------------------------------------------------------------------------------------------------------------ No results for input(s): HGBA1C in the last  72 hours. ------------------------------------------------------------------------------------------------------------------ No results for input(s): CHOL, HDL, LDLCALC, TRIG, CHOLHDL, LDLDIRECT in the last 72 hours. ------------------------------------------------------------------------------------------------------------------ No results for input(s): TSH, T4TOTAL, T3FREE, THYROIDAB in the last 72 hours.  Invalid input(s): FREET3 ------------------------------------------------------------------------------------------------------------------ No results for input(s): VITAMINB12, FOLATE, FERRITIN, TIBC, IRON, RETICCTPCT in the last 72 hours.  Coagulation profile  Recent Labs Lab 12/23/14 1312  INR 1.63*    No results for input(s): DDIMER in the last 72 hours.  Cardiac Enzymes No results for input(s): CKMB, TROPONINI, MYOGLOBIN in the last 168 hours.  Invalid input(s): CK ------------------------------------------------------------------------------------------------------------------ Invalid input(s): POCBNP  No results for input(s): GLUCAP in the last 72 hours.   Dequavius Kuhner M.D. [facility] 12/27/2014, 10:28 AM  Pager: [phone] Between 7am to 7pm - call Pager - [phone]  After 7pm go to [email or web address] - password [email or web address]  Call night  coverage person covering after 7pm

## 2014-12-28 DIAGNOSIS — K5909 Other constipation: Secondary | ICD-10-CM

## 2014-12-28 DIAGNOSIS — B952 Enterococcus as the cause of diseases classified elsewhere: Secondary | ICD-10-CM

## 2014-12-28 DIAGNOSIS — A09 Infectious gastroenteritis and colitis, unspecified: Secondary | ICD-10-CM

## 2014-12-28 DIAGNOSIS — R7881 Bacteremia: Secondary | ICD-10-CM

## 2014-12-28 DIAGNOSIS — I4891 Unspecified atrial fibrillation: Secondary | ICD-10-CM

## 2014-12-28 LAB — BASIC METABOLIC PANEL
Anion gap: 8 (ref 5–15)
BUN: 16 mg/dL (ref 6–20)
CALCIUM: 8.4 mg/dL — AB (ref 8.9–10.3)
CO2: 19 mmol/L — ABNORMAL LOW (ref 22–32)
Chloride: 111 mmol/L (ref 101–111)
Creatinine, Ser: 0.97 mg/dL (ref 0.44–1.00)
GFR calc Af Amer: >60 mL/min (ref >60)
GFR, EST NON AFRICAN AMERICAN: 52 mL/min — AB (ref >60)
GLUCOSE: 104 mg/dL — AB (ref 65–99)
Potassium: 3.6 mmol/L (ref 3.5–5.1)
SODIUM: 138 mmol/L (ref 135–145)

## 2014-12-28 LAB — STOOL CULTURE

## 2014-12-28 LAB — CBC
HCT: 29.7 % — ABNORMAL LOW (ref 36.0–46.0)
Hemoglobin: 9.6 g/dL — ABNORMAL LOW (ref 12.0–15.0)
MCH: 27.1 pg (ref 26.0–34.0)
MCHC: 32.3 g/dL (ref 30.0–36.0)
MCV: 83.9 fL (ref 78.0–100.0)
PLATELETS: 314 10*3/uL (ref 150–400)
RBC: 3.54 MIL/uL — AB (ref 3.87–5.11)
RDW: 14.5 % (ref 11.5–15.5)
WBC: 12.1 10*3/uL — ABNORMAL HIGH (ref 4.0–10.5)

## 2014-12-28 LAB — CULTURE, BLOOD (ROUTINE X 2): CULTURE: NO GROWTH

## 2014-12-28 LAB — URINE CULTURE: Culture: NO GROWTH

## 2014-12-28 LAB — HEPARIN LEVEL (UNFRACTIONATED): Heparin Unfractionated: 0.1 IU/mL — ABNORMAL LOW (ref 0.30–0.70)

## 2014-12-28 MED ORDER — POLYETHYLENE GLYCOL 3350 17 G PO PACK
17.0000 g | PACK | Freq: Once | ORAL | Status: AC
  Administered 2014-12-28: 17 g via ORAL
  Filled 2014-12-28: qty 1

## 2014-12-28 MED ORDER — METOPROLOL TARTRATE 50 MG PO TABS: 75.0000 mg | ORAL_TABLET | Freq: Four times a day (QID) | ORAL | Status: DC

## 2014-12-28 MED ORDER — METOPROLOL TARTRATE 50 MG PO TABS
75.0000 mg | ORAL_TABLET | Freq: Two times a day (BID) | ORAL | Status: DC
  Administered 2014-12-28 (×2): 75 mg via ORAL
  Filled 2014-12-28 (×4): qty 1

## 2014-12-28 MED ORDER — AMIODARONE HCL 200 MG PO TABS
400.0000 mg | ORAL_TABLET | Freq: Three times a day (TID) | ORAL | Status: DC
  Administered 2014-12-28 – 2015-01-01 (×12): 400 mg via ORAL
  Filled 2014-12-28 (×12): qty 2

## 2014-12-28 MED ORDER — HEPARIN (PORCINE) IN NACL 100-0.45 UNIT/ML-% IJ SOLN
1300.0000 [IU]/h | INTRAMUSCULAR | Status: AC
  Administered 2014-12-29: 1200 [IU]/h via INTRAVENOUS
  Administered 2014-12-30: 1300 [IU]/h via INTRAVENOUS
  Filled 2014-12-28 (×2): qty 250

## 2014-12-28 MED ORDER — AMIODARONE HCL 200 MG PO TABS
200.0000 mg | ORAL_TABLET | Freq: Two times a day (BID) | ORAL | Status: DC
  Administered 2014-12-28: 200 mg via ORAL
  Filled 2014-12-28: qty 1

## 2014-12-28 MED ORDER — HEPARIN (PORCINE) IN NACL 100-0.45 UNIT/ML-% IJ SOLN
800.0000 [IU]/h | INTRAMUSCULAR | Status: DC
  Administered 2014-12-28: 800 [IU]/h via INTRAVENOUS
  Filled 2014-12-28: qty 250

## 2014-12-28 NOTE — Progress Notes (Addendum)
[facility] PROGRESS NOTE  Penny Ramos BJY:[id number] DOB: May 19, 1927 DOA: 12/23/2014 PCP: No primary care provider on file.  BRIEF NARRATIVE 81 year old female with history of hypertension, hyperlipidemia, A. fib on Xarelto presented with acute abdominal pain with diarrhea. Patient reported being constipated for the last few days until the admission and she took some Colace. Following which she had several loose stools. She also developing lower abdominal but denied any hematochezia or melena. Patient was septic on admission elevated WBC, hypothermic with elevated lactic acid admitted to stepdown. Blood cultures was positive for Enterobacter and hospital course prolonged with new onset rapid A. fib.   Assessment/Plan: Enterobacter sepsis, possibly due to acute enteritis Patient septic on admission however no UA or culture sent on admission. Continue empiric Rocephin and Flagyl. Plan on 2 week course of antibiotic. (will transition Rocephin to oral Cipro upon discharge.) Has low-grade fever today with low white count. We'll continue to monitor. - Active problems A. fib with RVR . Required amiodarone drip and beta blocker. Heart rate in 110-120s episodes going up to 150. Amiodarone drip was discontinued yesterday but did not receive by mouth. Has been started on by mouth amiodarone 400 mg 3 times a day. holding off on increasing beta blocker dose. Cardizem dose reduced. Plan start IV heparin today since H&H has remained stable (after discussing with cardiologist) and monitor for further GI bleed. -2D echo with normal EF and no wall motion abnormality.  Acute on chronic blood loss anemia possibly secondary to GI bleed due to colitis Hemoglobin on admission was 11.2 with positive FOBT and dropped to 6.5 on 10/31. She received 2 units PRBC and hemoglobin has been stable since then. GI recommended conservative management and no colonoscopy at this time. -Plan to start her on IV heparin and  monitor for any further GI bleed so that anticoagulation could be resumed.  Enteritis/infectious colitis with diarrhea Improved and patient now constipated. C. difficile negative. Fecal lactoferrin positive. GI pathogen panel pending. Tolerating diet. Continue empiric antibiotics.  Essential hypertension Blood pressure. On low-dose metoprolol and Cardizem. Also on low-dose Imdur.  Prerenal acute kidney injury On admission secondary to dehydration with diarrhea. Now resolved.   Acute pancreatitis No clear etiology. History of cholecystectomy. Normal triglycerides for lipid panel. Now resolved. Tolerating diet.  Hyperlipidemia On statin.  Hypokalemia Replenished  Constipation ordered MiraLAX   DVT prophylaxis: IV heparin Diet: Heart healthy  Code Status: Full code Family Communication:  Updated her son Disposition Plan: Skilled nursing facility possibly in the next 48 hours if heart rate controlled and H&H stable.   Consultants:  Dr. Jacinto Halim (cardiology)  [facility]  Procedures:  2-D echo  Antibiotics:  Rocephin and flagyl (10/29--)  HPI/Subjective: HR elevated to 100-120, going upto 150 overnight. Patient reports being constipated past 3 days.  Objective: Filed Vitals:   12/28/14 0456  BP: 111/63  Pulse: 78  Temp: 100.1 F (37.8 C)  Resp: 20    Intake/Output Summary (Last 24 hours) at 12/28/14 1156 Last data filed at 12/27/14 2232  Gross per 24 hour  Intake  353.4 ml  Output    600 ml  Net -246.6 ml   Filed Weights   12/24/14 0456 12/25/14 0500 12/26/14 0500  Weight: 71.3 kg (157 lb 3 oz) 72.6 kg (160 lb 0.9 oz) 75 kg (165 lb 5.5 oz)    Exam:   General:  Elderly female in no acute distress  HEENT: Pallor present, moist oral mucosa, supple neck  Chest: Clear to  auscultation bilaterally    CVS: S1 and S2 irregularly irregular, no murmurs rub or gallop  GI: Soft, nondistended, nontender, bowel sounds present  Musculoskeletal: Warm, no  edema  CNS: Alert and oriented     Data Reviewed: Basic Metabolic Panel:  Recent Labs Lab 12/24/14 0020 12/24/14 0420 12/25/14 0140 12/26/14 0322 12/27/14 0354 12/28/14 0324  NA  --  137 142 136 134* 138  K  --  4.6 3.7 3.0* 3.3* 3.6  CL  --  110 116* 109 107 111  CO2  --  17* 20* 19* 20* 19*  GLUCOSE  --  138* 110* 116* 119* 104*  BUN  --  52* 35* 20 18 16   CREATININE  --  1.66* 1.36* 1.09* 0.95 0.97  CALCIUM  --  8.4* 8.4* 8.1* 8.6* 8.4*  MG 2.4  --   --   --   --   --   PHOS 4.6  --   --   --   --   --    Liver Function Tests:  Recent Labs Lab 12/23/14 1312 12/24/14 0420 12/25/14 0140 12/26/14 0322 12/27/14 0354  AST 60* 45* 36 31 24  ALT 17 17 15 15 14   ALKPHOS 388* 260* 176* 125 96  BILITOT 0.4 0.3 0.8 0.4 0.5  PROT 7.3 5.2* 4.7* 5.0* 5.0*  ALBUMIN 3.7 2.6* 2.2* 2.2* 2.1*    Recent Labs Lab 12/24/14 0020 12/24/14 0650 12/24/14 0917 12/25/14 0140 12/26/14 0322  LIPASE  --  19 19 18 20   AMYLASE 1154* 579* 535*  --   --    No results for input(s): AMMONIA in the last 168 hours. CBC:  Recent Labs Lab 12/23/14 1312  12/24/14 0420  12/25/14 0140 12/25/14 0756 12/25/14 1151 12/26/14 0322 12/27/14 0354 12/28/14 0324  WBC 22.3*  --  14.8*  --  10.0  --  11.6* 11.0* 11.1* 12.1*  NEUTROABS 19.1*  --  13.0*  --   --   --   --   --   --   --   HGB 11.1*  < > 8.0*  < > 6.5* 8.1* 9.4* 10.1* 10.0* 9.6*  HCT 36.9  < > 25.4*  --  20.7* 25.8* 28.9* 31.7* 30.9* 29.7*  MCV 90.9  --  89.4  --  87.0  --  86.0 84.8 84.4 83.9  PLT 633*  --  357  --  311  --  311 312 327 314  < > = values in this interval not displayed. Cardiac Enzymes: No results for input(s): CKTOTAL, CKMB, CKMBINDEX, TROPONINI in the last 168 hours. BNP (last 3 results)  Recent Labs  10/09/14 0803  BNP 332.9*    ProBNP (last 3 results) No results for input(s): PROBNP in the last 8760 hours.  CBG: No results for input(s): GLUCAP in the last 168 hours.  Recent Results (from the  past 240 hour(s))  C difficile quick scan w PCR reflex     Status: None   Collection Time: 12/23/14  4:50 PM  Result Value Ref Range Status   C Diff antigen NEGATIVE NEGATIVE Final   C Diff toxin NEGATIVE NEGATIVE Final   C Diff interpretation Negative for toxigenic C. difficile  Final  Blood culture (routine x 2)     Status: None   Collection Time: 12/23/14  6:05 PM  Result Value Ref Range Status   Specimen Description BLOOD LEFT ANTECUBITAL  Final   Special Requests BOTTLES DRAWN AEROBIC AND ANAEROBIC 5CC  Final   Culture  Setup Time   Final    GRAM NEGATIVE RODS AEROBIC BOTTLE ONLY CRITICAL RESULT CALLED TO, READ BACK BY AND VERIFIED WITH: D DILLON,RN AT 1610 12/24/14 BY L BENFIELD    Culture ENTEROBACTER AEROGENES  Final   Report Status 12/26/2014 FINAL  Final   Organism ID, Bacteria ENTEROBACTER AEROGENES  Final      Susceptibility   Enterobacter aerogenes - MIC*    CEFAZOLIN >=64 RESISTANT Resistant     CEFEPIME <=1 SENSITIVE Sensitive     CEFTAZIDIME <=1 SENSITIVE Sensitive     CEFTRIAXONE <=1 SENSITIVE Sensitive     CIPROFLOXACIN <=0.25 SENSITIVE Sensitive     GENTAMICIN <=1 SENSITIVE Sensitive     IMIPENEM 1 SENSITIVE Sensitive     TRIMETH/SULFA <=20 SENSITIVE Sensitive     PIP/TAZO <=4 SENSITIVE Sensitive     * ENTEROBACTER AEROGENES  Blood culture (routine x 2)     Status: None (Preliminary result)   Collection Time: 12/23/14  6:19 PM  Result Value Ref Range Status   Specimen Description BLOOD RIGHT HAND  Final   Special Requests BOTTLES DRAWN AEROBIC ONLY 2CC  Final   Culture NO GROWTH 4 DAYS  Final   Report Status PENDING  Incomplete  MRSA PCR Screening     Status: None   Collection Time: 12/24/14 12:00 AM  Result Value Ref Range Status   MRSA by PCR NEGATIVE NEGATIVE Final    Comment:        The GeneXpert MRSA Assay (FDA approved for NASAL specimens only), is one component of a comprehensive MRSA colonization surveillance program. It is not intended to  diagnose MRSA infection nor to guide or monitor treatment for MRSA infections.   Culture, Urine     Status: None   Collection Time: 12/24/14  5:30 AM  Result Value Ref Range Status   Specimen Description URINE, CLEAN CATCH  Final   Special Requests NONE  Final   Culture MULTIPLE SPECIES PRESENT, SUGGEST RECOLLECTION  Final   Report Status 12/25/2014 FINAL  Final  Stool culture     Status: None   Collection Time: 12/24/14  8:27 AM  Result Value Ref Range Status   Specimen Description STOOL  Final   Special Requests NONE  Final   Culture   Final    NO SALMONELLA, SHIGELLA, CAMPYLOBACTER, YERSINIA, OR E.COLI [id number] ISOLATED Note: REDUCED NORMAL FLORA PRESENT Performed at [facility]    Report Status 12/28/2014 FINAL  Final  Urine culture     Status: None   Collection Time: 12/27/14  5:00 AM  Result Value Ref Range Status   Specimen Description URINE, CLEAN CATCH  Final   Special Requests NONE  Final   Culture NO GROWTH 1 DAY  Final   Report Status 12/28/2014 FINAL  Final     Studies: No results found.  Scheduled Meds: . amiodarone  400 mg Oral TID  . atorvastatin  10 mg Oral q1800  . cefTRIAXone (ROCEPHIN)  IV  2 g Intravenous Q24H  . clorazepate  7.5 mg Oral BID  . diltiazem  180 mg Oral Daily  . metoprolol tartrate  75 mg Oral BID  . metronidazole  500 mg Intravenous Q8H  . saccharomyces boulardii  250 mg Oral BID   Continuous Infusions: . heparin 800 Units/hr (12/28/14 1133)      Time spent: 35 MINUTES    Ashby Leflore  [facility] Pager [phone]. If 7PM-7AM, please contact night-coverage at [email or web address], password  [email or web address] 12/28/2014, 11:56 AM  LOS: 5 days

## 2014-12-28 NOTE — Progress Notes (Addendum)
ANTICOAGULATION CONSULT NOTE - Initial Consult  Pharmacy Consult for Heparin Indication: atrial fibrillation  Allergies  Allergen Reactions  . Strawberry Extract Anaphylaxis  . Nsaids Nausea And Vomiting    Patient Measurements: Height: 5\' 3"  (160 cm) Weight: 165 lb 5.5 oz (75 kg) IBW/kg (Calculated) : 52.4 Heparin Dosing Weight: 68.5 kg  Vital Signs: Temp: 100.1 F (37.8 C) (11/03 0456) Temp Source: Oral (11/03 0456) BP: 111/63 mmHg (11/03 0456) Pulse Rate: 78 (11/03 0456)  Labs:  Recent Labs  12/26/14 0322 12/27/14 0354 12/28/14 0324  HGB 10.1* 10.0* 9.6*  HCT 31.7* 30.9* 29.7*  PLT 312 327 314  CREATININE 1.09* 0.95 0.97    Estimated Creatinine Clearance: 41.1 mL/min (by C-G formula based on Cr of 0.97).   Medical History: Past Medical History  Diagnosis Date  . Hypertension   . High cholesterol   . Atrial fibrillation [facility](HCC)    Assessment:   81 yr old female admitted 12/23/14 with abdominal pain and diarrhea, and passing blood.  FOB + 10/29 and 10/30, but no active bleeding per GI. Transfused on 10/30 when hemoglobin was 6.5.    Was on Xarelto 20 mg daily prior to admission. Last Xarelto dose 12/22/14.  Discussed with Dr. Gonzella Lexhungel.  To begin IV heparin conservatively, no bolus. Plan to transition back to Xarelto if no issues with bleeding.    Day # 6 Flagyl IV for gastroenteritis/infectious colitis. Diarrhea has resolved.     Day # 5 Ceftriaxone for Enterobacter in 1 of 2 blood cultures 10/29.      Goal of Therapy:  Heparin level 0.3-0.7 units/ml, but prefer 0.3-0.5 for now Monitor platelets by anticoagulation protocol: Yes   Plan:   Heparin drip to begin conservatively at 800 units/hr (~12 units/kg/hr)  Heparin level ~8hrs after drip begins.  Daily heparin level and CBC while on heparin.  Follow up for any bleeding.  Follow up for transition back to Xarelto.  Dennie FettersEgan, Kinzleigh Kandler Donovan, RPh Pager: [phone] 12/28/2014,10:31 AM

## 2014-12-28 NOTE — Progress Notes (Signed)
Subjective:  Feels much better although very weak, diarrhea has resolved but no BM for 2 days and feels constipated. HR still up and down. No chest pain, shortness breath or palpitations.  Objective:  Vital Signs in the last 24 hours: Temp:  [97.8 F (36.6 C)-100.1 F (37.8 C)] 100.1 F (37.8 C) (11/03 0456) Pulse Rate:  [31-117] 78 (11/03 0456) Resp:  [18-38] 20 (11/03 0456) BP: (99-149)/(50-85) 111/63 mmHg (11/03 0456) SpO2:  [94 %-100 %] 97 % (11/03 0456)  Intake/Output from previous day: 11/02 0701 - 11/03 0700 In: 420.2 [P.O.:220; I.V.:100.2; IV Piggyback:100] Out: 900 [Urine:900]  Physical Exam: General appearance: alert, cooperative, appears stated age, no distress and mildly obese Eyes: negative findings: lids and lashes normal Neck: no adenopathy, no carotid bruit, no JVD, supple, symmetrical, trachea midline and thyroid not enlarged, symmetric, no tenderness/mass/nodules Resp: Clear Chest wall: no tenderness Cardio: irregular S1 variable, S2 normal, I-II/VI SEM in apex. No click, rub or gallop GI: soft, non-tender; bowel sounds normal; no masses, no organomegaly. Obese Extremities: extremities normal, atraumatic, no cyanosis. 1+ lower extremity edema Pulses: 2+ and symmetric  Lab Results: BMP  Recent Labs  12/26/14 0322 12/27/14 0354 12/28/14 0324  NA 136 134* 138  K 3.0* 3.3* 3.6  CL 109 107 111  CO2 19* 20* 19*  GLUCOSE 116* 119* 104*  BUN 20 18 16   CREATININE 1.09* 0.95 0.97  CALCIUM 8.1* 8.6* 8.4*  GFRNONAA 45* 53* 52*  GFRAA 52* >60 >60    CBC  Recent Labs Lab 12/24/14 0420  12/28/14 0324  WBC 14.8*  < > 12.1*  RBC 2.84*  < > 3.54*  HGB 8.0*  < > 9.6*  HCT 25.4*  < > 29.7*  PLT 357  < > 314  MCV 89.4  < > 83.9  MCH 28.2  < > 27.1  MCHC 31.5  < > 32.3  RDW 12.8  < > 14.5  LYMPHSABS 1.1  --   --   MONOABS 0.7  --   --   EOSABS 0.0  --   --   BASOSABS 0.0  --   --   < > = values in this interval not displayed.  HEMOGLOBIN A1C Lab  Results  Component Value Date   HGBA1C 6.7* 10/10/2014   MPG 146 10/10/2014    Cardiac Panel (last 3 results)  Recent Labs  10/09/14 1541 10/09/14 2004 10/10/14 0140  TROPONINI 0.04* 0.04* 0.04*    TSH  Recent Labs  10/09/14 0803 12/24/14 0020  TSH 1.191 0.718    Recent Labs  12/25/14 0140 12/26/14 0322 12/27/14 0354  PROT 4.7* 5.0* 5.0*  ALBUMIN 2.2* 2.2* 2.1*  AST 36 31 24  ALT 15 15 14   ALKPHOS 176* 125 96  BILITOT 0.8 0.4 0.5    Cardiac Studies:  EKG: A. Fib with RVR  Echo 12/25/14:  Left ventricle: The cavity size was normal. Wall thickness was normal. Systolic function was normal. The estimated ejection fraction was in the range of 55% to 60%. Wall motion was normal; there were no regional wall motion abnormalities. Aortic valve: Valve area (VTI): 1.52 cm^2. Valve area (Vmax): 1.49 cm^2. Valve area (Vmean): 1.54 cm^2. Mitral valve: Valve area by continuity equation (using LVOT flow): 1.14 cm^2. Left atrium: The atrium was mildly dilated. Pulmonary arteries: Systolic pressure was moderately increased. PA peak pressure: 43 mm Hg (S). Pericardium, extracardiac: A trivial pericardial effusion wasidentified.  Scheduled Meds: . amiodarone  400 mg Oral TID  .  atorvastatin  10 mg Oral q1800  . cefTRIAXone (ROCEPHIN)  IV  2 g Intravenous Q24H  . clorazepate  7.5 mg Oral BID  . diltiazem  180 mg Oral Daily  . metoprolol tartrate  75 mg Oral BID  . metronidazole  500 mg Intravenous Q8H  . saccharomyces boulardii  250 mg Oral BID   Continuous Infusions:  PRN Meds:.acetaminophen **OR** acetaminophen, LORazepam, oxyCODONE   Assessment/Plan:  1. A. Fib with rVR CHA2DS2-VASCScore: Risk Score 5, Yearly risk of stroke 6.7%. OAC Has Bled: Score 2. Estimated risk of major bleeding at 1 year with OAC 1.8-3.2% 2. DM new 3. Acute renal failure now resolved 4. Acute bacterial enteritis no further diarrhoea 5. Acute blood loss anemia, probably GI tract from  acute enteritis. Anticoagulation on hold presently. 6. Deconditioning.  Recommendation: Heart rate is much better controlled, remains at 100-120 with episodes up to 150/min since IV amio discontinued. Will increase PO amio to 400 mg TID.  We could potentially restart angticoagulation as her hemoglobin is stable. Will probably need SNF and PT OT consult.     [name], MD 12/28/2014, 9:11 AM [facility] Pager: [phone] Office: [phone] If no answer: Cell:  [phone]

## 2014-12-28 NOTE — Progress Notes (Signed)
CSW met with pt to give bed offers- pt reports CSW should speak with her son Mitzi Hansen.  CSW provided bed offers to pt son- choice pending- son plans to have decision by tomorrow morning  CSW will continue to follow  Domenica Reamer, [place] Social Worker [phone]

## 2014-12-28 NOTE — Progress Notes (Signed)
ANTICOAGULATION CONSULT NOTE - FOLLOW UP    HL = 0.1 (goal 0.3 - 0.5 units/mL) Heparin dosing weight = 68 kg   Assessment: 87 YOF continues on IV heparin for AFib while Xarelto is on hold.  Heparin level is sub-therapeutic.  No bleeding documented.   Plan: - Increase heparin gtt to 1000 units/hr - Check 8 hr HL    Chimamanda Siegfried D. Laney Potashang, PharmD, BCPS 12/28/2014, 8:54 PM

## 2014-12-29 DIAGNOSIS — K5900 Constipation, unspecified: Secondary | ICD-10-CM

## 2014-12-29 DIAGNOSIS — I482 Chronic atrial fibrillation: Secondary | ICD-10-CM

## 2014-12-29 LAB — BASIC METABOLIC PANEL
ANION GAP: 11 (ref 5–15)
BUN: 16 mg/dL (ref 6–20)
CHLORIDE: 107 mmol/L (ref 101–111)
CO2: 20 mmol/L — AB (ref 22–32)
Calcium: 8 mg/dL — ABNORMAL LOW (ref 8.9–10.3)
Creatinine, Ser: 0.89 mg/dL (ref 0.44–1.00)
GFR calc non Af Amer: 57 mL/min — ABNORMAL LOW (ref >60)
GLUCOSE: 112 mg/dL — AB (ref 65–99)
POTASSIUM: 3.4 mmol/L — AB (ref 3.5–5.1)
Sodium: 138 mmol/L (ref 135–145)

## 2014-12-29 LAB — CBC
HEMATOCRIT: 28.4 % — AB (ref 36.0–46.0)
HEMOGLOBIN: 9.1 g/dL — AB (ref 12.0–15.0)
MCH: 27.3 pg (ref 26.0–34.0)
MCHC: 32 g/dL (ref 30.0–36.0)
MCV: 85.3 fL (ref 78.0–100.0)
Platelets: 277 10*3/uL (ref 150–400)
RBC: 3.33 MIL/uL — AB (ref 3.87–5.11)
RDW: 14.6 % (ref 11.5–15.5)
WBC: 10.3 10*3/uL (ref 4.0–10.5)

## 2014-12-29 LAB — HEPARIN LEVEL (UNFRACTIONATED)
HEPARIN UNFRACTIONATED: 0.17 [IU]/mL — AB (ref 0.30–0.70)
HEPARIN UNFRACTIONATED: 0.41 [IU]/mL (ref 0.30–0.70)
Heparin Unfractionated: 0.43 IU/mL (ref 0.30–0.70)

## 2014-12-29 MED ORDER — SENNOSIDES-DOCUSATE SODIUM 8.6-50 MG PO TABS: 1.0000 | ORAL_TABLET | Freq: Two times a day (BID) | ORAL | Status: DC

## 2014-12-29 MED ORDER — POLYETHYLENE GLYCOL 3350 17 G PO PACK
17.0000 g | PACK | Freq: Once | ORAL | Status: AC
  Administered 2014-12-29: 17 g via ORAL
  Filled 2014-12-29: qty 1

## 2014-12-29 MED ORDER — POLYETHYLENE GLYCOL 3350 17 G PO PACK: 17.0000 g | PACK | Freq: Every day | ORAL | Status: DC | PRN

## 2014-12-29 MED ORDER — METOPROLOL TARTRATE 25 MG PO TABS
25.0000 mg | ORAL_TABLET | Freq: Four times a day (QID) | ORAL | Status: DC
  Administered 2014-12-29 – 2015-01-01 (×13): 25 mg via ORAL
  Filled 2014-12-29 (×13): qty 1

## 2014-12-29 MED ORDER — POTASSIUM CHLORIDE CRYS ER 20 MEQ PO TBCR
40.0000 meq | EXTENDED_RELEASE_TABLET | Freq: Once | ORAL | Status: AC
  Administered 2014-12-29: 40 meq via ORAL
  Filled 2014-12-29: qty 2

## 2014-12-29 MED ORDER — SENNOSIDES-DOCUSATE SODIUM 8.6-50 MG PO TABS
2.0000 | ORAL_TABLET | Freq: Two times a day (BID) | ORAL | Status: DC
  Administered 2014-12-29: 2 via ORAL
  Filled 2014-12-29: qty 2

## 2014-12-29 NOTE — Progress Notes (Signed)
[facility] can accept when pt is ready for DC.  CSW will continue to follow  Merlyn LotJenna Holoman, Endoscopy Center At Redbird SquareCSWA Clinical Social Worker [phone]

## 2014-12-29 NOTE — Progress Notes (Signed)
ANTICOAGULATION CONSULT NOTE - Follow Up Consult  Pharmacy Consult for Heparin Indication: atrial fibrillation  Allergies  Allergen Reactions  . Strawberry Extract Anaphylaxis  . Nsaids Nausea And Vomiting    Patient Measurements: Height: 5\' 3"  (160 cm) Weight: 168 lb 14 oz (76.6 kg) IBW/kg (Calculated) : 52.4 Heparin Dosing Weight: 68.5 kg  Vital Signs: Temp: 99.3 F (37.4 C) (11/04 0446) Temp Source: Oral (11/04 0446) BP: 107/48 mmHg (11/04 0446) Pulse Rate: 78 (11/04 0446)  Labs:  Recent Labs  12/27/14 0354 12/28/14 0324 12/28/14 2005 12/29/14 0432  HGB 10.0* 9.6*  --  9.1*  HCT 30.9* 29.7*  --  28.4*  PLT 327 314  --  277  HEPARINUNFRC  --   --  0.10* 0.17*  CREATININE 0.95 0.97  --  0.89    Estimated Creatinine Clearance: 45.3 mL/min (by C-G formula based on Cr of 0.89).   Medical History: Past Medical History  Diagnosis Date  . Hypertension   . High cholesterol   . Atrial fibrillation [facility]HCC)    Assessment:   81 yr old female admitted 12/23/14 with abdominal pain and diarrhea, and passing blood.  FOB + 10/29 and 10/30, but no active bleeding per GI. Transfused on 10/30 when hemoglobin was 6.5.    Was on Xarelto 20 mg daily prior to admission. Last Xarelto dose 12/22/14. Per discussion with MD, started IV heparin conservatively, no bolus. Plan to transition back to Xarelto if no issues with bleeding.  HL sub-therapeutic this AM at 0.17 on 1000 units/hr. H/H down to 9.1/28.4. Plt wnl.       Goal of Therapy:  Heparin level 0.3-0.7 units/ml, but prefer 0.3-0.5 for now Monitor platelets by anticoagulation protocol: Yes   Plan:   Increase Heparin drip to 1200 units/hr  F/u 8 hr HL  Daily heparin level and CBC while on heparin.  Follow up for any bleeding.  Follow up for transition back to Xarelto.  Vinnie LevelBenjamin Sundai Probert, PharmD., BCPS Clinical Pharmacist Pager [phone]

## 2014-12-29 NOTE — Progress Notes (Addendum)
ANTICOAGULATION CONSULT NOTE - Follow Up Consult  Pharmacy Consult for Heparin Indication: atrial fibrillation  Allergies  Allergen Reactions  . Strawberry Extract Anaphylaxis  . Nsaids Nausea And Vomiting    Patient Measurements: Height: 5\' 3"  (160 cm) Weight: 168 lb 14 oz (76.6 kg) IBW/kg (Calculated) : 52.4 Heparin Dosing Weight: 68.5 kg  Vital Signs: Temp: 98.1 F (36.7 C) (11/04 1301) Temp Source: Oral (11/04 1301) BP: 117/55 mmHg (11/04 1301) Pulse Rate: 97 (11/04 1301)  Labs:  Recent Labs  12/27/14 0354 12/28/14 0324 12/28/14 2005 12/29/14 0432 12/29/14 1355  HGB 10.0* 9.6*  --  9.1*  --   HCT 30.9* 29.7*  --  28.4*  --   PLT 327 314  --  277  --   HEPARINUNFRC  --   --  0.10* 0.17* 0.41  CREATININE 0.95 0.97  --  0.89  --     Estimated Creatinine Clearance: 45.3 mL/min (by C-G formula based on Cr of 0.89).   Medical History: Past Medical History  Diagnosis Date  . Hypertension   . High cholesterol   . Atrial fibrillation [facility](HCC)    Assessment: 81 yr old female admitted 12/23/14 with abdominal pain and diarrhea, and passing blood. No active bleed found so restarted patient on herparin gtt to make sure there is no re-bleed for 24-48 hr before resuming Xarelto. Last dose 10/28 pta. Xarelto pta for afib (CHADS2VASC 5, HASBLED 2) - has been on hold d/t blood loss anemia/ question of GIB, but  no active bleed per GI. Last HL is now therapeutic at 0.41 after dose increase. Now Hgb is 9.1, plts wnl.  No s/s of bleed.     Goal of Therapy:  Heparin level 0.3-0.7 units/ml, but prefer 0.3-0.5 Monitor platelets by anticoagulation protocol: Yes   Plan:  Continue heparin gtt at 1200 units/hr Check 8 hr confirmatory HL Monitor daily HL, CBC, s/s of bleed F/U resumption of Xarelto  Enzo BiNathan Batchelder, PharmD Clinical Pharmacist Pager [phone] 12/29/2014 2:33 PM   Addendum: Confirmatory heparin level is therapeutic at 0.43. Continue heparin at 1200 units/hr and  follow up AM labs.  Louie CasaJennifer Ishmael Berkovich, PharmD, BCPS 12/29/2014, 10:30 PM

## 2014-12-29 NOTE — Care Management Important Message (Signed)
Important Message  Patient Details  Name: Penny Ramos MRN: [id number] Date of Birth: 12/23/1927   Medicare Important Message Given:  Yes-second notification given    Kyla BalzarineShealy, Harriette Tovey Abena 12/29/2014, 10:30 AM

## 2014-12-29 NOTE — Progress Notes (Signed)
[facility] PROGRESS NOTE  RHONDALYN CLINGAN ZOX:[id number] DOB: [birth year] DOA: 12/23/2014 PCP: No primary care provider on file.  BRIEF NARRATIVE 81 year old female with history of hypertension, hyperlipidemia, A. fib on Xarelto presented with acute abdominal pain with diarrhea. Patient reported being constipated for the last few days until the admission and she took some Colace. Following which she had several loose stools. She also developing lower abdominal but denied any hematochezia or melena. Patient was septic on admission elevated WBC, hypothermic with elevated lactic acid admitted to stepdown. Blood cultures was positive for Enterobacter and hospital course prolonged with new onset rapid A. fib.   Assessment/Plan: Enterobacter sepsis, possibly due to acute enteritis Patient septic on admission however no UA or culture sent on admission. Continue empiric Rocephin and Flagyl. Plan on 2 week course of antibiotic. (will transition Rocephin to oral Cipro upon discharge.) Low grade temp. Wbc normal.  We'll continue to monitor. - Active problems A. fib with RVR . Required amiodarone drip and beta blocker. Heart rate in 110-120s episodes going up to 150. Amiodarone drip discontinued and started on by mouth amiodarone 400 mg 3 times a day. holding off on increasing beta blocker dose. Cardizem dose reduced. HR well controlled past 24 hrs. Plan start IV heparin on 11/3 since H&H has remained stable (after discussing with cardiologist) and monitor for further GI bleed. Slight drop in H&H but stable.  -2D echo with normal EF and no wall motion abnormality.  Acute on chronic blood loss anemia possibly secondary to GI bleed due to colitis Hemoglobin on admission was 11.2 with positive FOBT and dropped to 6.5 on 10/31. She received 2 units PRBC and hemoglobin has been stable since then. GI recommended conservative management and no colonoscopy at this time. -started her on IV heparin and monitor  for any further GI bleed so that anticoagulation could be resumed.  Enteritis/infectious colitis with diarrhea Improved and patient now constipated. C. difficile negative. Fecal lactoferrin positive. GI pathogen panel pending. Tolerating diet. Continue empiric antibiotics.  Essential hypertension low normal lood pressure. On low-dose metoprolol and Cardizem. Also on low-dose Imdur.   Prerenal acute kidney injury On admission secondary to dehydration with diarrhea. Now resolved.   Acute pancreatitis No clear etiology. History of cholecystectomy. Normal triglycerides for lipid panel. Now resolved. Tolerating diet.  Hyperlipidemia On statin.  Hypokalemia Replenished  Constipation ordered MiraLAX without effect. Will add senna- colace.  DVT prophylaxis: IV heparin Diet: Heart healthy  Code Status: Full code Family Communication:  Updated her son on 11/3 Disposition Plan: Skilled nursing facility possibly on 11/5 or 11/7. Patient moving to an independent living in [place] possibly in a month or so.   Consultants:  Dr. Jacinto Halim (cardiology)  [facility]  Procedures:  2-D echo  Antibiotics:  Rocephin and flagyl (10/29--)  HPI/Subjective: HR stable overnight. Reports still being constipated and not getting enough PT.   Objective: Filed Vitals:   12/29/14 0446  BP: 107/48  Pulse: 78  Temp: 99.3 F (37.4 C)  Resp: 18    Intake/Output Summary (Last 24 hours) at 12/29/14 0809 Last data filed at 12/29/14 0727  Gross per 24 hour  Intake      0 ml  Output    200 ml  Net   -200 ml   Filed Weights   12/25/14 0500 12/26/14 0500 12/29/14 0444  Weight: 72.6 kg (160 lb 0.9 oz) 75 kg (165 lb 5.5 oz) 76.6 kg (168 lb 14 oz)    Exam:  General:   no acute distress  HEENT: Pallor present, moist oral mucosa, supple neck  Chest: Clear to auscultation bilaterally    CVS: S1 and S2 irregularly irregular, no murmurs rub or gallop  GI: Soft, nondistended, nontender, bowel  sounds present  Musculoskeletal: Warm, no edema       Data Reviewed: Basic Metabolic Panel:  Recent Labs Lab 12/24/14 0020  12/25/14 0140 12/26/14 0322 12/27/14 0354 12/28/14 0324 12/29/14 0432  NA  --   < > 142 136 134* 138 138  K  --   < > 3.7 3.0* 3.3* 3.6 3.4*  CL  --   < > 116* 109 107 111 107  CO2  --   < > 20* 19* 20* 19* 20*  GLUCOSE  --   < > 110* 116* 119* 104* 112*  BUN  --   < > 35* 20 18 16 16   CREATININE  --   < > 1.36* 1.09* 0.95 0.97 0.89  CALCIUM  --   < > 8.4* 8.1* 8.6* 8.4* 8.0*  MG 2.4  --   --   --   --   --   --   PHOS 4.6  --   --   --   --   --   --   < > = values in this interval not displayed. Liver Function Tests:  Recent Labs Lab 12/23/14 1312 12/24/14 0420 12/25/14 0140 12/26/14 0322 12/27/14 0354  AST 60* 45* 36 31 24  ALT 17 17 15 15 14   ALKPHOS 388* 260* 176* 125 96  BILITOT 0.4 0.3 0.8 0.4 0.5  PROT 7.3 5.2* 4.7* 5.0* 5.0*  ALBUMIN 3.7 2.6* 2.2* 2.2* 2.1*    Recent Labs Lab 12/24/14 0020 12/24/14 0650 12/24/14 0917 12/25/14 0140 12/26/14 0322  LIPASE  --  19 19 18 20   AMYLASE 1154* 579* 535*  --   --    No results for input(s): AMMONIA in the last 168 hours. CBC:  Recent Labs Lab 12/23/14 1312  12/24/14 0420  12/25/14 1151 12/26/14 0322 12/27/14 0354 12/28/14 0324 12/29/14 0432  WBC 22.3*  --  14.8*  < > 11.6* 11.0* 11.1* 12.1* 10.3  NEUTROABS 19.1*  --  13.0*  --   --   --   --   --   --   HGB 11.1*  < > 8.0*  < > 9.4* 10.1* 10.0* 9.6* 9.1*  HCT 36.9  < > 25.4*  < > 28.9* 31.7* 30.9* 29.7* 28.4*  MCV 90.9  --  89.4  < > 86.0 84.8 84.4 83.9 85.3  PLT 633*  --  357  < > 311 312 327 314 277  < > = values in this interval not displayed. Cardiac Enzymes: No results for input(s): CKTOTAL, CKMB, CKMBINDEX, TROPONINI in the last 168 hours. BNP (last 3 results)  Recent Labs  10/09/14 0803  BNP 332.9*    ProBNP (last 3 results) No results for input(s): PROBNP in the last 8760 hours.  CBG: No results for  input(s): GLUCAP in the last 168 hours.  Recent Results (from the past 240 hour(s))  C difficile quick scan w PCR reflex     Status: None   Collection Time: 12/23/14  4:50 PM  Result Value Ref Range Status   C Diff antigen NEGATIVE NEGATIVE Final   C Diff toxin NEGATIVE NEGATIVE Final   C Diff interpretation Negative for toxigenic C. difficile  Final  Blood culture (routine x 2)  Status: None   Collection Time: 12/23/14  6:05 PM  Result Value Ref Range Status   Specimen Description BLOOD LEFT ANTECUBITAL  Final   Special Requests BOTTLES DRAWN AEROBIC AND ANAEROBIC 5CC  Final   Culture  Setup Time   Final    GRAM NEGATIVE RODS AEROBIC BOTTLE ONLY CRITICAL RESULT CALLED TO, READ BACK BY AND VERIFIED WITH: D DILLON,RN AT 1324 12/24/14 BY L BENFIELD    Culture ENTEROBACTER AEROGENES  Final   Report Status 12/26/2014 FINAL  Final   Organism ID, Bacteria ENTEROBACTER AEROGENES  Final      Susceptibility   Enterobacter aerogenes - MIC*    CEFAZOLIN >=64 RESISTANT Resistant     CEFEPIME <=1 SENSITIVE Sensitive     CEFTAZIDIME <=1 SENSITIVE Sensitive     CEFTRIAXONE <=1 SENSITIVE Sensitive     CIPROFLOXACIN <=0.25 SENSITIVE Sensitive     GENTAMICIN <=1 SENSITIVE Sensitive     IMIPENEM 1 SENSITIVE Sensitive     TRIMETH/SULFA <=20 SENSITIVE Sensitive     PIP/TAZO <=4 SENSITIVE Sensitive     * ENTEROBACTER AEROGENES  Blood culture (routine x 2)     Status: None   Collection Time: 12/23/14  6:19 PM  Result Value Ref Range Status   Specimen Description BLOOD RIGHT HAND  Final   Special Requests BOTTLES DRAWN AEROBIC ONLY 2CC  Final   Culture NO GROWTH 5 DAYS  Final   Report Status 12/28/2014 FINAL  Final  MRSA PCR Screening     Status: None   Collection Time: 12/24/14 12:00 AM  Result Value Ref Range Status   MRSA by PCR NEGATIVE NEGATIVE Final    Comment:        The GeneXpert MRSA Assay (FDA approved for NASAL specimens only), is one component of a comprehensive MRSA  colonization surveillance program. It is not intended to diagnose MRSA infection nor to guide or monitor treatment for MRSA infections.   Culture, Urine     Status: None   Collection Time: 12/24/14  5:30 AM  Result Value Ref Range Status   Specimen Description URINE, CLEAN CATCH  Final   Special Requests NONE  Final   Culture MULTIPLE SPECIES PRESENT, SUGGEST RECOLLECTION  Final   Report Status 12/25/2014 FINAL  Final  Stool culture     Status: None   Collection Time: 12/24/14  8:27 AM  Result Value Ref Range Status   Specimen Description STOOL  Final   Special Requests NONE  Final   Culture   Final    NO SALMONELLA, SHIGELLA, CAMPYLOBACTER, YERSINIA, OR E.COLI 0157:[id number] ISOLATED Note: REDUCED NORMAL FLORA PRESENT Performed at [employer] [facility]    Report Status 12/28/2014 FINAL  Final  Urine culture     Status: None   Collection Time: 12/27/14  5:00 AM  Result Value Ref Range Status   Specimen Description URINE, CLEAN CATCH  Final   Special Requests NONE  Final   Culture NO GROWTH 1 DAY  Final   Report Status 12/28/2014 FINAL  Final     Studies: No results found.  Scheduled Meds: . amiodarone  400 mg Oral TID  . atorvastatin  10 mg Oral q1800  . cefTRIAXone (ROCEPHIN)  IV  2 g Intravenous Q24H  . clorazepate  7.5 mg Oral BID  . diltiazem  180 mg Oral Daily  . metoprolol tartrate  75 mg Oral BID  . metronidazole  500 mg Intravenous Q8H  . potassium chloride  40 mEq Oral Once  .  saccharomyces boulardii  250 mg Oral BID   Continuous Infusions: . heparin 1,000 Units/hr (12/28/14 2145)      Time spent: 25 MINUTES    Tavionna Grout  [facility] Pager [phone]. If 7PM-7AM, please contact night-coverage at [email or web address], password [facility] 12/29/2014, 8:09 AM  LOS: 6 days

## 2014-12-29 NOTE — Progress Notes (Signed)
Physical Therapy Treatment Patient Details Name: PRIA KLOSINSKI MRN: [id number] DOB: 21-Aug-1927 Today's Date: 12/29/2014    History of Present Illness Pt is a 81 y/o F who presents w/ enteritis w/ PMH of a-fib, back and ankle surgery.      PT Comments    Progressing slowly.  Pt wanting more activity.  Asked Nursing staff to assist mobility throughout the day.  Today's emphasis on trunk and LE exercise, scooting, transfers/safety and gait training.   Follow Up Recommendations  SNF;Supervision for mobility/OOB     Equipment Recommendations  None recommended by PT    Recommendations for Other Services       Precautions / Restrictions Precautions Precautions: Fall Restrictions Weight Bearing Restrictions: No    Mobility  Bed Mobility               General bed mobility comments: already in the chair  Transfers Overall transfer level: Needs assistance Equipment used: Rolling walker (2 wheeled) Transfers: Sit to/from Stand Sit to Stand: Min assist         General transfer comment: cues for scooting to EO chair, hand placement and then assist to come forward and lift  Ambulation/Gait Ambulation/Gait assistance: Min assist Ambulation Distance (Feet): 20 Feet (x2 out to a chair positioned in the hall, and back ) Assistive device: Rolling walker (2 wheeled) Gait Pattern/deviations: Decreased step length - right;Decreased step length - left Gait velocity: slow   General Gait Details: steady assist to decrease fears of falling.   Stairs            Wheelchair Mobility    Modified Rankin (Stroke Patients Only)       Balance Overall balance assessment: Needs assistance Sitting-balance support: No upper extremity supported Sitting balance-Leahy Scale: Fair       Standing balance-Leahy Scale: Poor Standing balance comment: reliant on RW or assist for steadying                    Cognition Arousal/Alertness: Awake/alert Behavior During  Therapy: Anxious Overall Cognitive Status: Within Functional Limits for tasks assessed                      Exercises General Exercises - Lower Extremity Ankle Circles/Pumps: AROM;Both;10 reps;Seated Hip Flexion/Marching: AROM;Strengthening;Both;10 reps;Seated (graded resistance) Toe Raises: AROM;Both;20 reps;Seated Heel Raises: AROM;Both;15 reps;Seated Mini-Sqauts: AROM;Both;10 reps;Standing    General Comments General comments (skin integrity, edema, etc.): END of session  EHR 89 bpm,, Sats 94%      Pertinent Vitals/Pain Pain Assessment: Faces Faces Pain Scale: Hurts little more Pain Location: vague aches Pain Descriptors / Indicators: Aching    Home Living                      Prior Function            PT Goals (current goals can now be found in the care plan section) Acute Rehab PT Goals Patient Stated Goal: to go to rehab before home PT Goal Formulation: With patient Time For Goal Achievement: 01/15/15 Potential to Achieve Goals: Good Progress towards PT goals: Progressing toward goals    Frequency  Min 3X/week    PT Plan Current plan remains appropriate    Co-evaluation             End of Session   Activity Tolerance: Patient tolerated treatment well Patient left: in chair;with call bell/phone within reach     Time: 1191-4782 PT Time Calculation (  min) (ACUTE ONLY): 33 min  Charges:  $Gait Training: 8-22 mins $Therapeutic Exercise: 8-22 mins                    G Codes:      Timmia Cogburn, Eliseo GumKenneth V 12/29/2014, 11:24 AM  12/29/2014  Cortland BingKen Blimy Napoleon, PT [phone]  (pager)

## 2014-12-30 DIAGNOSIS — K529 Noninfective gastroenteritis and colitis, unspecified: Secondary | ICD-10-CM

## 2014-12-30 LAB — CBC
HCT: 28.3 % — ABNORMAL LOW (ref 36.0–46.0)
Hemoglobin: 8.9 g/dL — ABNORMAL LOW (ref 12.0–15.0)
MCH: 26.7 pg (ref 26.0–34.0)
MCHC: 31.4 g/dL (ref 30.0–36.0)
MCV: 85 fL (ref 78.0–100.0)
Platelets: 291 K/uL (ref 150–400)
RBC: 3.33 MIL/uL — ABNORMAL LOW (ref 3.87–5.11)
RDW: 14.6 % (ref 11.5–15.5)
WBC: 9.2 K/uL (ref 4.0–10.5)

## 2014-12-30 LAB — BASIC METABOLIC PANEL
Anion gap: 11 (ref 5–15)
BUN: 16 mg/dL (ref 6–20)
CALCIUM: 8.3 mg/dL — AB (ref 8.9–10.3)
CHLORIDE: 108 mmol/L (ref 101–111)
CO2: 19 mmol/L — ABNORMAL LOW (ref 22–32)
CREATININE: 0.76 mg/dL (ref 0.44–1.00)
GFR calc non Af Amer: >60 mL/min (ref >60)
Glucose, Bld: 110 mg/dL — ABNORMAL HIGH (ref 65–99)
Potassium: 3.7 mmol/L (ref 3.5–5.1)
SODIUM: 138 mmol/L (ref 135–145)

## 2014-12-30 LAB — HEPARIN LEVEL (UNFRACTIONATED): Heparin Unfractionated: 0.29 IU/mL — ABNORMAL LOW (ref 0.30–0.70)

## 2014-12-30 MED ORDER — RIVAROXABAN 15 MG PO TABS
15.0000 mg | ORAL_TABLET | Freq: Every day | ORAL | Status: DC
  Administered 2014-12-30 – 2014-12-31 (×2): 15 mg via ORAL
  Filled 2014-12-30 (×2): qty 1

## 2014-12-30 NOTE — Progress Notes (Signed)
ANTICOAGULATION CONSULT NOTE - Follow Up Consult  Pharmacy Consult for Heparin Indication: atrial fibrillation  Allergies  Allergen Reactions  . Strawberry Extract Anaphylaxis  . Nsaids Nausea And Vomiting    Patient Measurements: Height: 5\' 3"  (160 cm) Weight: 176 lb 2.4 oz (79.9 kg) IBW/kg (Calculated) : 52.4 Heparin Dosing Weight: 68.5 kg  Vital Signs: Temp: 98.7 F (37.1 C) (11/05 0327) Temp Source: Oral (11/05 0327) BP: 118/60 mmHg (11/05 0327) Pulse Rate: 117 (11/05 0327)  Labs:  Recent Labs  12/28/14 0324  12/29/14 0432 12/29/14 1355 12/29/14 2205 12/30/14 0313  HGB 9.6*  --  9.1*  --   --  8.9*  HCT 29.7*  --  28.4*  --   --  28.3*  PLT 314  --  277  --   --  291  HEPARINUNFRC  --   < > 0.17* 0.41 0.43 0.29*  CREATININE 0.97  --  0.89  --   --   --   < > = values in this interval not displayed.  Estimated Creatinine Clearance: 46.3 mL/min (by C-G formula based on Cr of 0.89).   Medical History: Past Medical History  Diagnosis Date  . Hypertension   . High cholesterol   . Atrial fibrillation [facility](HCC)    Assessment: 81 yr old female admitted 12/23/14 with abdominal pain and diarrhea, and passing blood. No active bleed found so restarted patient on herparin gtt to make sure there is no re-bleed for 24-48 hr before resuming Xarelto. Last dose 10/28 pta. Xarelto pta for afib (CHADS2VASC 5, HASBLED 2) - has been on hold d/t blood loss anemia/ question of GIB, but  no active bleed per GI. HL was therapeutic till yesterday but is sub-therapeutic this AM at 0.29. H/H remains low but stable, Plt wnl. RN reports no s/s of bleeding     Goal of Therapy:  Heparin level 0.3-0.7 units/ml, but prefer 0.3-0.5 Monitor platelets by anticoagulation protocol: Yes   Plan:  Increase heparin infusion to 1300 units/hr  F/u 8 hr HL  Monitor daily HL, CBC, s/s of bleed F/U resumption of Xarelto  Vinnie LevelBenjamin Eldoris Beiser, PharmD., BCPS Clinical Pharmacist Pager [phone]

## 2014-12-30 NOTE — Progress Notes (Signed)
ANTICOAGULATION CONSULT NOTE - Follow Up Consult  Pharmacy Consult for Heparin >> Xarelto Indication: atrial fibrillation  Allergies  Allergen Reactions  . Strawberry Extract Anaphylaxis  . Nsaids Nausea And Vomiting    Patient Measurements: Height: 5\' 3"  (160 cm) Weight: 176 lb 2.4 oz (79.9 kg) IBW/kg (Calculated) : 52.4 Heparin Dosing Weight: 68.5 kg  Vital Signs: Temp: 98.7 F (37.1 C) (11/05 0327) Temp Source: Oral (11/05 0327) BP: 118/60 mmHg (11/05 0327) Pulse Rate: 117 (11/05 0327)  Labs:  Recent Labs  12/28/14 0324  12/29/14 0432 12/29/14 1355 12/29/14 2205 12/30/14 0313  HGB 9.6*  --  9.1*  --   --  8.9*  HCT 29.7*  --  28.4*  --   --  28.3*  PLT 314  --  277  --   --  291  HEPARINUNFRC  --   < > 0.17* 0.41 0.43 0.29*  CREATININE 0.97  --  0.89  --   --  0.76  < > = values in this interval not displayed.  Estimated Creatinine Clearance: 51.5 mL/min (by C-G formula based on Cr of 0.76).   Medical History: Past Medical History  Diagnosis Date  . Hypertension   . High cholesterol   . Atrial fibrillation [facility](HCC)    Assessment: 81 yr old female admitted 12/23/14 with abdominal pain and diarrhea, and passing blood. No active bleed found so restarted patient on herparin gtt to make sure there is no re-bleed for 24-48 hr before resuming Xarelto. Last dose 10/28 pta.   Xarelto pta for afib (CHADS2VASC 5, HASBLED 2) - has been on hold d/t blood loss anemia/ question of GIB, but no active bleed per GI. To transition from heparin to Xarelto starting TONIGHT per MD. Communicated with RN to turn off heparin when 1st dose of Xarelto is given with food. CrCl is just above the cut-off for Xarelto dose reduction at ~51, and patient is already on reduced dose at home. Will resume home dose. H/H remains low but stable, Plt wnl. RN reports no s/s of bleeding.     Goal of Therapy:  Stroke prevention Monitor platelets by anticoagulation protocol: Yes   Plan:  Heparin IV >>  Xarelto 15mg  qsupper (pta dose) Turn off heparin when 1st dose of Xarelto given with food 8h HL, daily HL/CBC while still on heparin Mon s/sx bleeding  Babs BertinHaley Marieclaire Bettenhausen, PharmD Clinical Pharmacist Pager [phone] 12/30/2014 8:57 AM

## 2014-12-30 NOTE — Progress Notes (Signed)
[facility] PROGRESS NOTE  Penny Ramos:[id number] DOB: Mar 27, 1927 DOA: 12/23/2014 PCP: No primary care provider on file.  BRIEF NARRATIVE 81 year old female with history of hypertension, hyperlipidemia, A. fib on Xarelto presented with acute abdominal pain with diarrhea. Patient reported being constipated for the last few days until the admission and she took some Colace. Following which she had several loose stools. She also developing lower abdominal but denied any hematochezia or melena. Patient was septic on admission elevated WBC, hypothermic with elevated lactic acid admitted to stepdown. Blood cultures was positive for Enterobacter and hospital course prolonged with new onset rapid A. fib.   Assessment/Plan: Enterobacter sepsis, possibly due to acute enteritis Patient septic on admission however no UA or culture sent on admission. Sepsis and enteritis now resolved. Continue empiric Rocephin and Flagyl. Plan on 2 week course of antibiotic. ( transition Rocephin to oral Cipro upon discharge.) Afebrile.. Wbc normal.    Active problems A. fib with RVR . Required amiodarone drip and beta blocker. Amiodarone drip discontinued and started on by mouth amiodarone 400 mg 3 times a day. holding off on increasing beta blocker dose. Cardizem dose reduced. HR well controlled with occasionally going to 110-120. -started on  IV heparin on 11/3 since H&H  remained stable (after discussing with cardiologist) and monitor for further GI bleed. H&H stable.  -2D echo with normal EF and no wall motion abnormality. -switch to xarelto today and monitor h&H closely.  Acute on chronic blood loss anemia possibly secondary to GI bleed due to colitis Hemoglobin on admission was 11.2 with positive FOBT and dropped to 6.5 on 10/31. She received 2 units PRBC and hemoglobin has been stable since then. GI recommended conservative management and no colonoscopy at this time. -h&H stable without further GI  bleed after heparin resumed on 11/3. Resume xarelto today.  Enteritis/infectious colitis with diarrhea Improved and patient now constipated. C. difficile negative. Fecal lactoferrin positive. GI pathogen panel and stool culture negative. Tolerating diet. Continue empiric antibiotics.  Essential hypertension low normal lood pressure. On low-dose metoprolol and Cardizem. Also on low-dose Imdur.   Prerenal acute kidney injury On admission secondary to dehydration with diarrhea. Now resolved.   Acute pancreatitis No clear etiology. History of cholecystectomy. Normal triglycerides for lipid panel. Now resolved. Tolerating diet.  Hyperlipidemia On statin.  Hypokalemia Replenished  Constipation Resolved with bowel regimen.   DVT prophylaxis: Xarelto Diet: Heart healthy  Code Status: Full code Family Communication:  Updated her son on 11/3 Disposition Plan: Skilled nursing facility possibly on 11/7. Patient moving to an independent living in [place] possibly in a month or so.   Consultants:  Dr. Jacinto Halim (cardiology)  [facility]  Procedures:  2-D echo  Antibiotics:  Rocephin and flagyl (10/29--)  HPI/Subjective: Constipation resolved and reports having 3-4 level bowel movement. Heart rate stable mostly   Objective: Filed Vitals:   12/30/14 0327  BP: 118/60  Pulse: 117  Temp: 98.7 F (37.1 C)  Resp: 18    Intake/Output Summary (Last 24 hours) at 12/30/14 1102 Last data filed at 12/30/14 0949  Gross per 24 hour  Intake    480 ml  Output    652 ml  Net   -172 ml   Filed Weights   12/26/14 0500 12/29/14 0444 12/30/14 0327  Weight: 75 kg (165 lb 5.5 oz) 76.6 kg (168 lb 14 oz) 79.9 kg (176 lb 2.4 oz)    Exam:   General:   no acute distress  HEENT:moist oral mucosa,  supple neck  Chest: Clear to auscultation bilaterally    CVS: S1 and S2 irregularly irregular, no murmurs rub or gallop  GI: Soft, nondistended, nontender, bowel sounds  present  Musculoskeletal: Warm, no edema       Data Reviewed: Basic Metabolic Panel:  Recent Labs Lab 12/24/14 0020  12/26/14 0322 12/27/14 0354 12/28/14 0324 12/29/14 0432 12/30/14 0313  NA  --   < > 136 134* 138 138 138  K  --   < > 3.0* 3.3* 3.6 3.4* 3.7  CL  --   < > 109 107 111 107 108  CO2  --   < > 19* 20* 19* 20* 19*  GLUCOSE  --   < > 116* 119* 104* 112* 110*  BUN  --   < > CREATININE  --   < > 1.09* 0.95 0.97 0.89 0.76  CALCIUM  --   < > 8.1* 8.6* 8.4* 8.0* 8.3*  MG 2.4  --   --   --   --   --   --   PHOS 4.6  --   --   --   --   --   --   < > = values in this interval not displayed. Liver Function Tests:  Recent Labs Lab 12/23/14 1312 12/24/14 0420 12/25/14 0140 12/26/14 0322 12/27/14 0354  AST 60* 45* 36 31 24  ALT ALKPHOS 388* 260* 176* 125 96  BILITOT 0.4 0.3 0.8 0.4 0.5  PROT 7.3 5.2* 4.7* 5.0* 5.0*  ALBUMIN 3.7 2.6* 2.2* 2.2* 2.1*    Recent Labs Lab 12/24/14 0020 12/24/14 0650 12/24/14 0917 12/25/14 0140 12/26/14 0322  LIPASE  --  AMYLASE 1154* 579* 535*  --   --    No results for input(s): AMMONIA in the last 168 hours. CBC:  Recent Labs Lab 12/23/14 1312  12/24/14 0420  12/26/14 0322 12/27/14 0354 12/28/14 0324 12/29/14 0432 12/30/14 0313  WBC 22.3*  --  14.8*  < > 11.0* 11.1* 12.1* 10.3 9.2  NEUTROABS 19.1*  --  13.0*  --   --   --   --   --   --   HGB 11.1*  < > 8.0*  < > 10.1* 10.0* 9.6* 9.1* 8.9*  HCT 36.9  < > 25.4*  < > 31.7* 30.9* 29.7* 28.4* 28.3*  MCV 90.9  --  89.4  < > 84.8 84.4 83.9 85.3 85.0  PLT 633*  --  357  < > 312 327 314 277 291  < > = values in this interval not displayed. Cardiac Enzymes: No results for input(s): CKTOTAL, CKMB, CKMBINDEX, TROPONINI in the last 168 hours. BNP (last 3 results)  Recent Labs  10/09/14 0803  BNP 332.9*    ProBNP (last 3 results) No results for input(s): PROBNP in the last 8760 hours.  CBG: No results for input(s):  GLUCAP in the last 168 hours.  Recent Results (from the past 240 hour(s))  C difficile quick scan w PCR reflex     Status: None   Collection Time: 12/23/14  4:50 PM  Result Value Ref Range Status   C Diff antigen NEGATIVE NEGATIVE Final   C Diff toxin NEGATIVE NEGATIVE Final   C Diff interpretation Negative for toxigenic C. difficile  Final  Blood culture (routine x 2)     Status: None   Collection Time: 12/23/14  6:05 PM  Result Value Ref Range Status   Specimen Description BLOOD LEFT ANTECUBITAL  Final   Special Requests BOTTLES DRAWN AEROBIC AND ANAEROBIC 5CC  Final   Culture  Setup Time   Final    GRAM NEGATIVE RODS AEROBIC BOTTLE ONLY CRITICAL RESULT CALLED TO, READ BACK BY AND VERIFIED WITH: D DILLON,RN AT [id number] 12/24/14 BY L BENFIELD    Culture ENTEROBACTER AEROGENES  Final   Report Status 12/26/2014 FINAL  Final   Organism ID, Bacteria ENTEROBACTER AEROGENES  Final      Susceptibility   Enterobacter aerogenes - MIC*    CEFAZOLIN >=64 RESISTANT Resistant     CEFEPIME <=1 SENSITIVE Sensitive     CEFTAZIDIME <=1 SENSITIVE Sensitive     CEFTRIAXONE <=1 SENSITIVE Sensitive     CIPROFLOXACIN <=0.25 SENSITIVE Sensitive     GENTAMICIN <=1 SENSITIVE Sensitive     IMIPENEM 1 SENSITIVE Sensitive     TRIMETH/SULFA <=20 SENSITIVE Sensitive     PIP/TAZO <=4 SENSITIVE Sensitive     * ENTEROBACTER AEROGENES  Blood culture (routine x 2)     Status: None   Collection Time: 12/23/14  6:19 PM  Result Value Ref Range Status   Specimen Description BLOOD RIGHT HAND  Final   Special Requests BOTTLES DRAWN AEROBIC ONLY 2CC  Final   Culture NO GROWTH 5 DAYS  Final   Report Status 12/28/2014 FINAL  Final  MRSA PCR Screening     Status: None   Collection Time: 12/24/14 12:00 AM  Result Value Ref Range Status   MRSA by PCR NEGATIVE NEGATIVE Final    Comment:        The GeneXpert MRSA Assay (FDA approved for NASAL specimens only), is one component of a comprehensive MRSA  colonization surveillance program. It is not intended to diagnose MRSA infection nor to guide or monitor treatment for MRSA infections.   Culture, Urine     Status: None   Collection Time: 12/24/14  5:30 AM  Result Value Ref Range Status   Specimen Description URINE, CLEAN CATCH  Final   Special Requests NONE  Final   Culture MULTIPLE SPECIES PRESENT, SUGGEST RECOLLECTION  Final   Report Status 12/25/2014 FINAL  Final  Stool culture     Status: None   Collection Time: 12/24/14  8:27 AM  Result Value Ref Range Status   Specimen Description STOOL  Final   Special Requests NONE  Final   Culture   Final    NO SALMONELLA, SHIGELLA, CAMPYLOBACTER, YERSINIA, OR E.COLI [id number] ISOLATED Note: REDUCED NORMAL FLORA PRESENT Performed at [facility]    Report Status 12/28/2014 FINAL  Final  Urine culture     Status: None   Collection Time: 12/27/14  5:00 AM  Result Value Ref Range Status   Specimen Description URINE, CLEAN CATCH  Final   Special Requests NONE  Final   Culture NO GROWTH 1 DAY  Final   Report Status 12/28/2014 FINAL  Final     Studies: No results found.  Scheduled Meds: . amiodarone  400 mg Oral TID  . atorvastatin  10 mg Oral q1800  . cefTRIAXone (ROCEPHIN)  IV  2 g Intravenous Q24H  . clorazepate  7.5 mg Oral BID  . diltiazem  180 mg Oral Daily  . metoprolol tartrate  25 mg Oral QID  . metronidazole  500 mg Intravenous Q8H  . rivaroxaban  15 mg Oral Q supper  . saccharomyces boulardii  250 mg Oral BID   Continuous Infusions: .  heparin 1,300 Units/hr (12/30/14 1610)      Time spent: 25 MINUTES    Madelline Eshbach  [facility] Pager [phone]. If 7PM-7AM, please contact night-coverage at [email or web address], password [facility] 12/30/2014, 11:02 AM  LOS: 7 days

## 2014-12-31 DIAGNOSIS — I493 Ventricular premature depolarization: Secondary | ICD-10-CM

## 2014-12-31 LAB — CBC
HCT: 29.1 % — ABNORMAL LOW (ref 36.0–46.0)
Hemoglobin: 9.1 g/dL — ABNORMAL LOW (ref 12.0–15.0)
MCH: 26.7 pg (ref 26.0–34.0)
MCHC: 31.3 g/dL (ref 30.0–36.0)
MCV: 85.3 fL (ref 78.0–100.0)
PLATELETS: 362 10*3/uL (ref 150–400)
RBC: 3.41 MIL/uL — ABNORMAL LOW (ref 3.87–5.11)
RDW: 14.5 % (ref 11.5–15.5)
WBC: 8.7 10*3/uL (ref 4.0–10.5)

## 2014-12-31 LAB — MAGNESIUM: MAGNESIUM: 1.1 mg/dL — AB (ref 1.7–2.4)

## 2014-12-31 LAB — POTASSIUM: Potassium: 4 mmol/L (ref 3.5–5.1)

## 2014-12-31 NOTE — Progress Notes (Signed)
[facility] PROGRESS NOTE  Penny Ramos ZOX:[id number] DOB: 12-26-1927 DOA: 12/23/2014 PCP: No primary care provider on file.  BRIEF NARRATIVE 81 year old female with history of hypertension, hyperlipidemia, A. fib on Xarelto presented with acute abdominal pain with diarrhea. Patient reported being constipated for the last few days until the admission and she took some Colace. Following which she had several loose stools. She also developing lower abdominal but denied any hematochezia or melena. Patient was septic on admission elevated WBC, hypothermic with elevated lactic acid admitted to stepdown. Blood cultures was positive for Enterobacter and hospital course prolonged with new onset rapid A. fib.   Assessment/Plan:  Active problems A. fib with RVR . Required amiodarone drip and beta blocker. Amiodarone drip discontinued and started on by mouth amiodarone 400 mg 3 times a day. holding off on increasing beta blocker dose. Cardizem dose reduced. HR well controlled with multiple PVCs noted on monitor overnight. Check potassium and magnesium. -started on  IV heparin on 11/3 since H&H  remained stable (after discussing with cardiologist) and monitor for further GI bleed. H&H stable.  -2D echo with normal EF and no wall motion abnormality. -switched to xarelto on 11/5. H&H stable.    Acute on chronic blood loss anemia possibly secondary to GI bleed due to colitis Hemoglobin on admission was 11.2 with positive FOBT and dropped to 6.5 on 10/31. She received 2 units PRBC and hemoglobin has been stable since then. GI recommended conservative management and no colonoscopy at this time. -h&H stable without further GI bleed after heparin resumed on 11/3. Resume xarelto today.  Enterobacter sepsis, possibly due to acute enteritis Patient septic on admission however no UA or culture sent on admission. Sepsis and enteritis now resolved. Continue empiric Rocephin and Flagyl. Plan on 2 week course  of antibiotic. ( transition Rocephin to oral Cipro upon discharge.) Afebrile.. Wbc normal.     Enteritis/infectious colitis with diarrhea Improved and patient now constipated. C. difficile negative. Fecal lactoferrin positive. GI pathogen panel and stool culture negative. Tolerating diet. Continue empiric antibiotics.  Essential hypertension low normal lood pressure. On low-dose metoprolol and Cardizem. Also on low-dose Imdur.   Prerenal acute kidney injury On admission secondary to dehydration with diarrhea. Now resolved.   Acute pancreatitis No clear etiology. History of cholecystectomy. Normal triglycerides for lipid panel. Now resolved. Tolerating diet.  Hyperlipidemia On statin.  Hypokalemia Replenished  Constipation Resolved with bowel regimen.   DVT prophylaxis: Xarelto Diet: Heart healthy  Code Status: Full code Family Communication:  Will update her son Disposition Plan: Skilled nursing facility tomorrow.  Patient moving to an independent living in [place] possibly in a month or so.   Consultants:  Dr. Jacinto Halim (cardiology)  [facility]  Procedures:  2-D echo  Antibiotics:  Rocephin and flagyl (10/29--)  HPI/Subjective: No overnight issues except for frequent PVCs on monitor. Patient denies any symptoms. Is anxious.   Objective: Filed Vitals:   12/31/14 0422  BP: 125/78  Pulse: 93  Temp: 98.1 F (36.7 C)  Resp: 18    Intake/Output Summary (Last 24 hours) at 12/31/14 1035 Last data filed at 12/31/14 1005  Gross per 24 hour  Intake    540 ml  Output    451 ml  Net     89 ml   Filed Weights   12/29/14 0444 12/30/14 0327 12/31/14 0422  Weight: 76.6 kg (168 lb 14 oz) 79.9 kg (176 lb 2.4 oz) 78.382 kg (172 lb 12.8 oz)    Exam:  General:   no acute distress  HEENT:moist oral mucosa, supple neck  Chest: Clear to auscultation bilaterally    CVS: S1 and S2 irregularly irregular, no murmurs rub or gallop  GI: Soft, nondistended, nontender,  bowel sounds present  Musculoskeletal: Warm, no edema       Data Reviewed: Basic Metabolic Panel:  Recent Labs Lab 12/26/14 0322 12/27/14 0354 12/28/14 0324 12/29/14 0432 12/30/14 0313  NA 136 134* 138 138 138  K 3.0* 3.3* 3.6 3.4* 3.7  CL 109 107 111 107 108  CO2 19* 20* 19* 20* 19*  GLUCOSE 116* 119* 104* 112* 110*  BUN CREATININE 1.09* 0.95 0.97 0.89 0.76  CALCIUM 8.1* 8.6* 8.4* 8.0* 8.3*   Liver Function Tests:  Recent Labs Lab 12/25/14 0140 12/26/14 0322 12/27/14 0354  AST 36 31 24  ALT ALKPHOS 176* 125 96  BILITOT 0.8 0.4 0.5  PROT 4.7* 5.0* 5.0*  ALBUMIN 2.2* 2.2* 2.1*    Recent Labs Lab 12/25/14 0140 12/26/14 0322  LIPASE 18 20   No results for input(s): AMMONIA in the last 168 hours. CBC:  Recent Labs Lab 12/27/14 0354 12/28/14 0324 12/29/14 0432 12/30/14 0313 12/31/14 0120  WBC 11.1* 12.1* 10.3 9.2 8.7  HGB 10.0* 9.6* 9.1* 8.9* 9.1*  HCT 30.9* 29.7* 28.4* 28.3* 29.1*  MCV 84.4 83.9 85.3 85.0 85.3  PLT 327 314 277 291 362   Cardiac Enzymes: No results for input(s): CKTOTAL, CKMB, CKMBINDEX, TROPONINI in the last 168 hours. BNP (last 3 results)  Recent Labs  10/09/14 0803  BNP 332.9*    ProBNP (last 3 results) No results for input(s): PROBNP in the last 8760 hours.  CBG: No results for input(s): GLUCAP in the last 168 hours.  Recent Results (from the past 240 hour(s))  C difficile quick scan w PCR reflex     Status: None   Collection Time: 12/23/14  4:50 PM  Result Value Ref Range Status   C Diff antigen NEGATIVE NEGATIVE Final   C Diff toxin NEGATIVE NEGATIVE Final   C Diff interpretation Negative for toxigenic C. difficile  Final  Blood culture (routine x 2)     Status: None   Collection Time: 12/23/14  6:05 PM  Result Value Ref Range Status   Specimen Description BLOOD LEFT ANTECUBITAL  Final   Special Requests BOTTLES DRAWN AEROBIC AND ANAEROBIC 5CC  Final   Culture  Setup Time   Final     GRAM NEGATIVE RODS AEROBIC BOTTLE ONLY CRITICAL RESULT CALLED TO, READ BACK BY AND VERIFIED WITH: D DILLON,RN AT 4098 12/24/14 BY L BENFIELD    Culture ENTEROBACTER AEROGENES  Final   Report Status 12/26/2014 FINAL  Final   Organism ID, Bacteria ENTEROBACTER AEROGENES  Final      Susceptibility   Enterobacter aerogenes - MIC*    CEFAZOLIN >=64 RESISTANT Resistant     CEFEPIME <=1 SENSITIVE Sensitive     CEFTAZIDIME <=1 SENSITIVE Sensitive     CEFTRIAXONE <=1 SENSITIVE Sensitive     CIPROFLOXACIN <=0.25 SENSITIVE Sensitive     GENTAMICIN <=1 SENSITIVE Sensitive     IMIPENEM 1 SENSITIVE Sensitive     TRIMETH/SULFA <=20 SENSITIVE Sensitive     PIP/TAZO <=4 SENSITIVE Sensitive     * ENTEROBACTER AEROGENES  Blood culture (routine x 2)     Status: None   Collection Time: 12/23/14  6:19 PM  Result Value Ref Range Status   Specimen Description  BLOOD RIGHT HAND  Final   Special Requests BOTTLES DRAWN AEROBIC ONLY 2CC  Final   Culture NO GROWTH 5 DAYS  Final   Report Status 12/28/2014 FINAL  Final  MRSA PCR Screening     Status: None   Collection Time: 12/24/14 12:00 AM  Result Value Ref Range Status   MRSA by PCR NEGATIVE NEGATIVE Final    Comment:        The GeneXpert MRSA Assay (FDA approved for NASAL specimens only), is one component of a comprehensive MRSA colonization surveillance program. It is not intended to diagnose MRSA infection nor to guide or monitor treatment for MRSA infections.   Culture, Urine     Status: None   Collection Time: 12/24/14  5:30 AM  Result Value Ref Range Status   Specimen Description URINE, CLEAN CATCH  Final   Special Requests NONE  Final   Culture MULTIPLE SPECIES PRESENT, SUGGEST RECOLLECTION  Final   Report Status 12/25/2014 FINAL  Final  Stool culture     Status: None   Collection Time: 12/24/14  8:27 AM  Result Value Ref Range Status   Specimen Description STOOL  Final   Special Requests NONE  Final   Culture   Final    NO  SALMONELLA, SHIGELLA, CAMPYLOBACTER, YERSINIA, OR E.COLI 0157:H7 ISOLATED Note: REDUCED NORMAL FLORA PRESENT Performed at [facility]    Report Status 12/28/2014 FINAL  Final  Urine culture     Status: None   Collection Time: 12/27/14  5:00 AM  Result Value Ref Range Status   Specimen Description URINE, CLEAN CATCH  Final   Special Requests NONE  Final   Culture NO GROWTH 1 DAY  Final   Report Status 12/28/2014 FINAL  Final     Studies: No results found.  Scheduled Meds: . amiodarone  400 mg Oral TID  . atorvastatin  10 mg Oral q1800  . cefTRIAXone (ROCEPHIN)  IV  2 g Intravenous Q24H  . clorazepate  7.5 mg Oral BID  . diltiazem  180 mg Oral Daily  . metoprolol tartrate  25 mg Oral QID  . metronidazole  500 mg Intravenous Q8H  . rivaroxaban  15 mg Oral Q supper  . saccharomyces boulardii  250 mg Oral BID   Continuous Infusions:      Time spent: 25 MINUTES    Eddie NorthHUNGEL, Amry Cathy  [facility] Pager [phone]. If 7PM-7AM, please contact night-coverage at [email or web address], password [email or web address] 12/31/2014, 10:35 AM  LOS: 8 days

## 2014-12-31 NOTE — Progress Notes (Signed)
Patient resting comfortably. No distress or pain, Call light within reach.

## 2015-01-01 DIAGNOSIS — A419 Sepsis, unspecified organism: Secondary | ICD-10-CM

## 2015-01-01 DIAGNOSIS — K922 Gastrointestinal hemorrhage, unspecified: Secondary | ICD-10-CM

## 2015-01-01 DIAGNOSIS — R1030 Lower abdominal pain, unspecified: Secondary | ICD-10-CM

## 2015-01-01 DIAGNOSIS — E872 Acidosis: Secondary | ICD-10-CM

## 2015-01-01 DIAGNOSIS — R197 Diarrhea, unspecified: Secondary | ICD-10-CM

## 2015-01-01 LAB — BASIC METABOLIC PANEL
Anion gap: 10 (ref 5–15)
BUN: 10 mg/dL (ref 6–20)
CALCIUM: 8.6 mg/dL — AB (ref 8.9–10.3)
CO2: 23 mmol/L (ref 22–32)
CREATININE: 0.82 mg/dL (ref 0.44–1.00)
Chloride: 105 mmol/L (ref 101–111)
Glucose, Bld: 125 mg/dL — ABNORMAL HIGH (ref 65–99)
Potassium: 3.7 mmol/L (ref 3.5–5.1)
SODIUM: 138 mmol/L (ref 135–145)

## 2015-01-01 LAB — CBC
HCT: 28.1 % — ABNORMAL LOW (ref 36.0–46.0)
HEMOGLOBIN: 9.1 g/dL — AB (ref 12.0–15.0)
MCH: 27.5 pg (ref 26.0–34.0)
MCHC: 32.4 g/dL (ref 30.0–36.0)
MCV: 84.9 fL (ref 78.0–100.0)
PLATELETS: 303 10*3/uL (ref 150–400)
RBC: 3.31 MIL/uL — ABNORMAL LOW (ref 3.87–5.11)
RDW: 14.9 % (ref 11.5–15.5)
WBC: 11.8 10*3/uL — ABNORMAL HIGH (ref 4.0–10.5)

## 2015-01-01 LAB — MAGNESIUM: MAGNESIUM: 2.1 mg/dL (ref 1.7–2.4)

## 2015-01-01 MED ORDER — SACCHAROMYCES BOULARDII 250 MG PO CAPS: 250.0000 mg | ORAL_CAPSULE | Freq: Two times a day (BID) | ORAL | Status: AC

## 2015-01-01 MED ORDER — LOPERAMIDE HCL 2 MG PO CAPS: 2.0000 mg | ORAL_CAPSULE | ORAL | Status: AC | PRN

## 2015-01-01 MED ORDER — AMIODARONE HCL 200 MG PO TABS: 400.0000 mg | ORAL_TABLET | Freq: Two times a day (BID) | ORAL | Status: AC

## 2015-01-01 MED ORDER — METOPROLOL TARTRATE 50 MG PO TABS: 25.0000 mg | ORAL_TABLET | Freq: Four times a day (QID) | ORAL | Status: AC

## 2015-01-01 MED ORDER — DILTIAZEM HCL ER COATED BEADS 180 MG PO CP24: 180.0000 mg | ORAL_CAPSULE | Freq: Every day | ORAL | Status: AC

## 2015-01-01 MED ORDER — METRONIDAZOLE 500 MG PO TABS: 500.0000 mg | ORAL_TABLET | Freq: Three times a day (TID) | ORAL | Status: AC

## 2015-01-01 MED ORDER — MAGNESIUM SULFATE 4 GM/100ML IV SOLN
4.0000 g | Freq: Once | INTRAVENOUS | Status: AC
  Administered 2015-01-01: 4 g via INTRAVENOUS
  Filled 2015-01-01: qty 100

## 2015-01-01 MED ORDER — CIPROFLOXACIN HCL 500 MG PO TABS: 500.0000 mg | ORAL_TABLET | Freq: Two times a day (BID) | ORAL | Status: AC

## 2015-01-01 NOTE — Care Management Important Message (Signed)
Important Message  Patient Details  Name: Penny Ramos MRN: [id number] Date of Birth: 07/19/1927   Medicare Important Message Given:  Yes-third notification given    Cherylann ParrClaxton, Chayse Zatarain S, RN 01/01/2015, 10:17 AM

## 2015-01-01 NOTE — Discharge Summary (Addendum)
Physician Discharge Summary  Penny Ramos UJW:[id number] DOB: 07/10/1927 DOA: 12/23/2014  PCP: No primary care provider on file.  Admit date: 12/23/2014 Discharge date: 01/01/2015  Time spent: 35 minutes  Recommendations for Outpatient Follow-up:  1. Discharge to SNF with outpt follow up with Dr Jacinto Halim n 2 weeks 2. Please monitor h&H at least 2 times a week for next few weeks while at the rehab. 3. Patient was complete 2 weeks course of antibiotics (ciprofloxacin and Flagyl) on 01/06/2015. 4. Patient does get anxious and worried about her overall health . She may benefit from short acting low-dose benzodiazepine 1-2 times a day as needed for her symptoms. ( She refused it when I offered to her this morning).  Discharge Diagnoses:  Principal Problem:   Sepsis, unspecified organism [facility])  Active Problems:   Enteritis   Atrial fibrillation with RVR (HCC)   Essential hypertension   Acute diarrhea   Lactic acidosis   Lower abdominal pain   Infectious colitis   Other acute pancreatitis   Acute GI bleeding   Chronic atrial fibrillation (HCC)   HLD (hyperlipidemia)   Discharge Condition: fair  Diet recommendation: heart healthy  Filed Weights   12/30/14 0327 12/31/14 0422 01/01/15 0330  Weight: 79.9 kg (176 lb 2.4 oz) 78.382 kg (172 lb 12.8 oz) 77.656 kg (171 lb 3.2 oz)    History of present illness:  81 year old female with history of hypertension, hyperlipidemia, A. fib on Xarelto presented with acute abdominal pain with diarrhea. Patient reported being constipated for the last few days until the admission and she took some Colace. Following which she had several loose stools. She also developing lower abdominal but denied any hematochezia or melena. Patient was septic on admission elevated WBC, hypothermic with elevated lactic acid admitted to stepdown. Blood cultures was positive for Enterobacter and hospital course prolonged with new onset rapid A. fib.  Hospital Course:    Active problems A. fib with RVR -Required amiodarone drip and beta blocker. Amiodarone drip discontinued and started on by mouth amiodarone . Maintained on metoprolol and Cardizem.Marland Kitchen HR well controlled with multiple PVCs noted on monitor overnight. Check potassium and magnesium. -started on IV heparin on 11/3 since H&H remained stable (after discussing with cardiologist Dr. Jacinto Halim) and monitor for further GI bleed.-switched to xarelto on 11/5. H&H has remained stable.   -2D echo with normal EF and no wall motion abnormality.    Acute on chronic blood loss anemia possibly secondary to GI bleed due to colitis Hemoglobin on admission was 11.2 with positive FOBT and dropped to 6.5 on 10/31. She received 2 units PRBC and hemoglobin has been stable since then. GI recommended conservative management and no colonoscopy at this time. -h&H stable without further GI bleed after heparin resumed on 11/3. Xarelto started on 11/5 and has been tolerating well. Please monitor H&H at least twice a week for next few weeks while she is at the rehabilitation.  Enterobacter sepsis, possibly due to acute enteritis Patient septic on admission however no UA or culture sent on admission. Sepsis and enteritis now resolved. Given empiric Rocephin and Flagyl. Plan on 2 week course of antibiotic. Switched Rocephin to oral ciprofloxacin upon discharge.  Enteritis/infectious colitis with diarrhea Improved and patient now constipated. C. difficile negative. Fecal lactoferrin positive. GI pathogen panel and stool culture negative. Tolerating diet. Continue empiric antibiotics.  Essential hypertension Stable.. On  metoprolol and Cardizem.   Prerenal acute kidney injury On admission secondary to dehydration with diarrhea. Now resolved.  Acute pancreatitis No clear etiology. History of cholecystectomy. Normal triglycerides for lipid panel. Now resolved. Tolerating diet.  Hyperlipidemia On  statin.  Hypokalemia Replenished  Constipation needing with diarrhea. Currently having loose stools. Stool for C. difficile negative. Ordered loperamide as needed.  Multiple PVCs on telemetry Noted for low magnesium which has been replaced this morning.  Patient seen by physical therapy and recommended skilled nursing facility. She stable to be discharged today with outpatient follow-up.   Code Status: Full code Family Communication: Will update her son Disposition Plan: Skilled nursing facility.. Patient moving to an independent living in [place] possibly in a month or so.   Consultants:  Dr. Jacinto HalimGanji (cardiology)  [facility]  Procedures:  2-D echo  Antibiotics:  Rocephin and flagyl (10/29--)  Cipro and Flagyl 11/7 until 11/12  Discharge Exam: Filed Vitals:   01/01/15 0330  BP: 167/67  Pulse: 86  Temp: 100.1 F (37.8 C)  Resp: 18     General: Elderly female in no acute distress,   anxious  HEENT: Pallor present, moist oral mucosa, supple neck  Chest: Clear to auscultation bilaterally   CVS: S1 and S2 regular, no murmurs rub or gallop  GI: Soft, nondistended, nontender, bowel sounds present  Musculoskeletal: Warm, no edema  CNS: Alert and oriented  Discharge Instructions    Current Discharge Medication List    START taking these medications   Details  amiodarone (PACERONE) 200 MG tablet Take 2 tablets (400 mg total) by mouth 2 (two) times daily. Qty: 60 tablet, Refills: 0    ciprofloxacin (CIPRO) 500 MG tablet Take 1 tablet (500 mg total) by mouth 2 (two) times daily. Qty: 10 tablet, Refills: 0    metoprolol tartrate (LOPRESSOR) 50 MG tablet Take 0.5 tablets (25 mg total) by mouth 4 (four) times daily. Qty: 60 tablet, Refills: 0    metroNIDAZOLE (FLAGYL) 500 MG tablet Take 1 tablet (500 mg total) by mouth 3 (three) times daily. Qty: 15 tablet, Refills: 0    saccharomyces boulardii (FLORASTOR) 250 MG capsule Take 1 capsule (250 mg total)  by mouth 2 (two) times daily. Qty: 30 capsule, Refills: 0    Loperamide 2 mg tablet                         Take 1 tablet as needed for diarrhea  CONTINUE these medications which have CHANGED   Details  diltiazem (CARDIZEM CD) 180 MG 24 hr capsule Take 1 capsule (180 mg total) by mouth daily. Qty: 30 capsule, Refills: 0      CONTINUE these medications which have NOT CHANGED   Details  acetaminophen (TYLENOL) 500 MG tablet Take 500-1,000 mg by mouth every 6 (six) hours as needed for mild pain, moderate pain, fever or headache.     atorvastatin (LIPITOR) 10 MG tablet Take 10 mg by mouth daily.    clorazepate (TRANXENE) 7.5 MG tablet Take 7.5 mg by mouth 2 (two) times daily.     lactose free nutrition (BOOST) LIQD Take 237 mLs by mouth daily.    Rivaroxaban (XARELTO) 15 MG TABS tablet Take 1 tablet (15 mg total) by mouth daily with supper. Qty: 30 tablet, Refills: 6    phenol (CHLORASEPTIC) 1.4 % LIQD Use as directed 1 spray in the mouth or throat as needed for throat irritation / pain.      STOP taking these medications     flecainide (TAMBOCOR) 100 MG tablet      lisinopril-hydrochlorothiazide (PRINZIDE,ZESTORETIC)  20-25 MG per tablet        Allergies  Allergen Reactions  . Strawberry Extract Anaphylaxis  . Nsaids Nausea And Vomiting   Follow-up Information    Follow up with [facility].   Specialty:  Skilled Nursing Facility   Contact information:   [address] [place] [address] [phone]      Follow up with [name], MD. Call in 2 weeks.   Specialty:  Cardiology   Contact information:   [address] [place] [address] [phone]        The results of significant diagnostics from this hospitalization (including imaging, microbiology, ancillary and laboratory) are listed below for reference.    Significant Diagnostic Studies: Ct Abdomen Pelvis Wo Contrast  12/23/2014  CLINICAL DATA:  Lower abdominal pain.   Constipation for 3 days EXAM: CT ABDOMEN AND PELVIS WITHOUT CONTRAST TECHNIQUE: Multidetector CT imaging of the abdomen and pelvis was performed following the standard protocol without IV contrast. COMPARISON:  None. FINDINGS: Lower chest: Lung bases are clear. Hepatobiliary: No focal hepatic lesion. Gallbladder is not identified. No biliary duct dilatation Pancreas: Pancreas is normal. No ductal dilatation. No pancreatic inflammation. Spleen: Normal spleen Adrenals/urinary tract: Adrenal glands and kidneys are normal. The ureters and bladder normal. Stomach/Bowel: Stomach is mildly distended with fluid. There is fluid in the duodenum. The proximal small bowel is normal caliber. The distal small bowel is likewise normal caliber. The cecum is fluid-filled. Appendix not identified. Transverse colon is normal caliber. There is fluid stool in the descending colon.several diverticula of the sigmoid colon without acute inflammation. Rectum is normal. Vascular/Lymphatic: Abdominal aorta is normal caliber with atherosclerotic calcification. There is no retroperitoneal or periportal lymphadenopathy. No pelvic lymphadenopathy. Reproductive: Post hysterectomy. Musculoskeletal: Severe degenerative change of the lumbar spine without acute findings Other: No free fluid in the abdomen or pelvis. IMPRESSION: 1. Fluid stool throughout the colon suggests diarrheal disease. 2. No clear evidence of bowel obstruction. No oral contrast administered. 3. Severe atherosclerotic calcification of the aorta. 4. Severe degenerative changes lumbar spine. Electronically Signed   By: Genevive Bi M.D.   On: 12/23/2014 15:34    Microbiology: Recent Results (from the past 240 hour(s))  C difficile quick scan w PCR reflex     Status: None   Collection Time: 12/23/14  4:50 PM  Result Value Ref Range Status   C Diff antigen NEGATIVE NEGATIVE Final   C Diff toxin NEGATIVE NEGATIVE Final   C Diff interpretation Negative for toxigenic C.  difficile  Final  Blood culture (routine x 2)     Status: None   Collection Time: 12/23/14  6:05 PM  Result Value Ref Range Status   Specimen Description BLOOD LEFT ANTECUBITAL  Final   Special Requests BOTTLES DRAWN AEROBIC AND ANAEROBIC 5CC  Final   Culture  Setup Time   Final    GRAM NEGATIVE RODS AEROBIC BOTTLE ONLY CRITICAL RESULT CALLED TO, READ BACK BY AND VERIFIED WITH: D DILLON,RN AT 4132 12/24/14 BY L BENFIELD    Culture ENTEROBACTER AEROGENES  Final   Report Status 12/26/2014 FINAL  Final   Organism ID, Bacteria ENTEROBACTER AEROGENES  Final      Susceptibility   Enterobacter aerogenes - MIC*    CEFAZOLIN >=64 RESISTANT Resistant     CEFEPIME <=1 SENSITIVE Sensitive     CEFTAZIDIME <=1 SENSITIVE Sensitive     CEFTRIAXONE <=1 SENSITIVE Sensitive     CIPROFLOXACIN <=0.25 SENSITIVE Sensitive  GENTAMICIN <=1 SENSITIVE Sensitive     IMIPENEM 1 SENSITIVE Sensitive     TRIMETH/SULFA <=20 SENSITIVE Sensitive     PIP/TAZO <=4 SENSITIVE Sensitive     * ENTEROBACTER AEROGENES  Blood culture (routine x 2)     Status: None   Collection Time: 12/23/14  6:19 PM  Result Value Ref Range Status   Specimen Description BLOOD RIGHT HAND  Final   Special Requests BOTTLES DRAWN AEROBIC ONLY 2CC  Final   Culture NO GROWTH 5 DAYS  Final   Report Status 12/28/2014 FINAL  Final  MRSA PCR Screening     Status: None   Collection Time: 12/24/14 12:00 AM  Result Value Ref Range Status   MRSA by PCR NEGATIVE NEGATIVE Final    Comment:        The GeneXpert MRSA Assay (FDA approved for NASAL specimens only), is one component of a comprehensive MRSA colonization surveillance program. It is not intended to diagnose MRSA infection nor to guide or monitor treatment for MRSA infections.   Culture, Urine     Status: None   Collection Time: 12/24/14  5:30 AM  Result Value Ref Range Status   Specimen Description URINE, CLEAN CATCH  Final   Special Requests NONE  Final   Culture MULTIPLE  SPECIES PRESENT, SUGGEST RECOLLECTION  Final   Report Status 12/25/2014 FINAL  Final  Stool culture     Status: None   Collection Time: 12/24/14  8:27 AM  Result Value Ref Range Status   Specimen Description STOOL  Final   Special Requests NONE  Final   Culture   Final    NO SALMONELLA, SHIGELLA, CAMPYLOBACTER, YERSINIA, OR E.COLI 0157:H7 ISOLATED Note: REDUCED NORMAL FLORA PRESENT Performed at [facility]    Report Status 12/28/2014 FINAL  Final  Urine culture     Status: None   Collection Time: 12/27/14  5:00 AM  Result Value Ref Range Status   Specimen Description URINE, CLEAN CATCH  Final   Special Requests NONE  Final   Culture NO GROWTH 1 DAY  Final   Report Status 12/28/2014 FINAL  Final     Labs: Basic Metabolic Panel:  Recent Labs Lab 12/26/14 0322 12/27/14 0354 12/28/14 0324 12/29/14 0432 12/30/14 0313 12/31/14 1046  NA 136 134* 138 138 138  --   K 3.0* 3.3* 3.6 3.4* 3.7 4.0  CL 109 107 111 107 108  --   CO2 19* 20* 19* 20* 19*  --   GLUCOSE 116* 119* 104* 112* 110*  --   BUN --   CREATININE 1.09* 0.95 0.97 0.89 0.76  --   CALCIUM 8.1* 8.6* 8.4* 8.0* 8.3*  --   MG  --   --   --   --   --  1.1*   Liver Function Tests:  Recent Labs Lab 12/26/14 0322 12/27/14 0354  AST 31 24  ALT 15 14  ALKPHOS 125 96  BILITOT 0.4 0.5  PROT 5.0* 5.0*  ALBUMIN 2.2* 2.1*    Recent Labs Lab 12/26/14 0322  LIPASE 20   No results for input(s): AMMONIA in the last 168 hours. CBC:  Recent Labs Lab 12/28/14 0324 12/29/14 0432 12/30/14 0313 12/31/14 0120 01/01/15 0401  WBC 12.1* 10.3 9.2 8.7 11.8*  HGB 9.6* 9.1* 8.9* 9.1* 9.1*  HCT 29.7* 28.4* 28.3* 29.1* 28.1*  MCV 83.9 85.3 85.0 85.3 84.9  PLT 314 277 291 362 303   Cardiac Enzymes:  No results for input(s): CKTOTAL, CKMB, CKMBINDEX, TROPONINI in the last 168 hours. BNP: BNP (last 3 results)  Recent Labs  10/09/14 0803  BNP 332.9*    ProBNP (last 3 results) No results for  input(s): PROBNP in the last 8760 hours.  CBG: No results for input(s): GLUCAP in the last 168 hours.     SignedEddie North  [facility] 01/01/2015, 8:42 AM

## 2015-01-01 NOTE — Progress Notes (Signed)
[facility]  Alert and oriented x4.  No c/o pain. Pt still c/o generalized weakness.  Pt has reddened patch to right posterior forearm from past IV infiltration.  Pt was transported with education on diet, activity, meds, and follow-up care and instructions.  Pt verbalized understanding.  IV D/Cd.  Tele D/Cd.

## 2015-01-01 NOTE — Clinical Social Work Note (Signed)
Patient to be d/c'ed today to [facility].  Patient and family agreeable to plans will transport via ems RN to call report.  Joh Rao, MSW, LCSWA [phone]  

## 2015-01-04 ENCOUNTER — Non-Acute Institutional Stay (SKILLED_NURSING_FACILITY): Payer: Medicare Other | Admitting: Adult Health

## 2015-01-04 ENCOUNTER — Encounter: Payer: Self-pay | Admitting: Adult Health

## 2015-01-04 DIAGNOSIS — K219 Gastro-esophageal reflux disease without esophagitis: Secondary | ICD-10-CM | POA: Diagnosis not present

## 2015-01-04 DIAGNOSIS — K529 Noninfective gastroenteritis and colitis, unspecified: Secondary | ICD-10-CM | POA: Diagnosis not present

## 2015-01-04 DIAGNOSIS — I4891 Unspecified atrial fibrillation: Secondary | ICD-10-CM

## 2015-01-04 DIAGNOSIS — D62 Acute posthemorrhagic anemia: Secondary | ICD-10-CM | POA: Diagnosis not present

## 2015-01-04 DIAGNOSIS — F419 Anxiety disorder, unspecified: Secondary | ICD-10-CM | POA: Diagnosis not present

## 2015-01-04 DIAGNOSIS — E785 Hyperlipidemia, unspecified: Secondary | ICD-10-CM

## 2015-01-04 DIAGNOSIS — R5381 Other malaise: Secondary | ICD-10-CM

## 2015-01-04 DIAGNOSIS — R600 Localized edema: Secondary | ICD-10-CM | POA: Diagnosis not present

## 2015-01-05 ENCOUNTER — Encounter: Payer: Self-pay | Admitting: Internal Medicine

## 2015-01-05 NOTE — Progress Notes (Deleted)
Patient ID: Penny Ramos, female   DOB: 09-11-1927, 81 y.o.   MRN: [id number]     [facility]  PCP: No primary care provider on file.  Code Status: Full Code   Allergies  Allergen Reactions  . Strawberry Extract Anaphylaxis  . Nsaids Nausea And Vomiting    Chief Complaint  Patient presents with  . New Admit To SNF    New Admission      HPI:  81 y.o. patient is here for short term rehabilitation post hospital admission from   Review of Systems:  Constitutional: Negative for fever, chills, malaise/fatigue and diaphoresis.  HENT: Negative for headache, congestion, nasal discharge, hearing loss, earache, sore throat, difficulty swallowing.   Eyes: Negative for eye pain, blurred vision, double vision and discharge.  Respiratory: Negative for cough, shortness of breath and wheezing.   Cardiovascular: Negative for chest pain, palpitations, leg swelling.  Gastrointestinal: Negative for heartburn, nausea, vomiting, abdominal pain, loss of appetite, melena, diarrhea and constipation.  Genitourinary: Negative for dysuria, urgency, frequency, hematuria, incontinence and flank pain.  Musculoskeletal: Negative for back pain, falls, joint pain and myalgias. assistive device used Skin: Negative for itching, sores and rash.  Neurological: Negative for weakness,dizziness, tingling, focal weakness Psychiatric/Behavioral: Negative for depression, anxiety, insomnia and memory loss.    Past Medical History  Diagnosis Date  . Hypertension   . High cholesterol   . Atrial fibrillation [facility])    Past Surgical History  Procedure Laterality Date  . Cholecystectomy    . Abdominal hysterectomy    . Back surgery    . Ankle surgery    . Appendectomy    . Cardioversion N/A 10/31/2014    Procedure: CARDIOVERSION;  Surgeon: [name], MD;  Location: [facility] ENDOSCOPY;  Service: Cardiovascular;  Laterality: N/A;   Social History:   reports that she has quit smoking. She does not have  any smokeless tobacco history on file. She reports that she does not drink alcohol or use illicit drugs.  History reviewed. No pertinent family history.  Medications:   Medication List       This list is accurate as of: 01/05/15  2:30 PM.  Always use your most recent med list.               acetaminophen 500 MG tablet  Commonly known as:  TYLENOL  Take 500-1,000 mg by mouth every 6 (six) hours as needed for mild pain, moderate pain, fever or headache.     amiodarone 200 MG tablet  Commonly known as:  PACERONE  Take 2 tablets (400 mg total) by mouth 2 (two) times daily.     atorvastatin 10 MG tablet  Commonly known as:  LIPITOR  Take 10 mg by mouth daily.     CHLORASEPTIC 1.4 % Liqd  Generic drug:  phenol  Use as directed 1 spray in the mouth or throat as needed for throat irritation / pain.     ciprofloxacin 500 MG tablet  Commonly known as:  CIPRO  Take 1 tablet (500 mg total) by mouth 2 (two) times daily.     clorazepate 7.5 MG tablet  Commonly known as:  TRANXENE  Take 7.5 mg by mouth 2 (two) times daily.     diltiazem 180 MG 24 hr capsule  Commonly known as:  CARDIZEM CD  Take 1 capsule (180 mg total) by mouth daily.     loperamide 2 MG capsule  Commonly known as:  IMODIUM  Take 1 capsule (  2 mg total) by mouth as needed for diarrhea or loose stools.     metoprolol 50 MG tablet  Commonly known as:  LOPRESSOR  Take 0.5 tablets (25 mg total) by mouth 4 (four) times daily.     metroNIDAZOLE 500 MG tablet  Commonly known as:  FLAGYL  Take 1 tablet (500 mg total) by mouth 3 (three) times daily.     Rivaroxaban 15 MG Tabs tablet  Commonly known as:  XARELTO  Take 1 tablet (15 mg total) by mouth daily with supper.     saccharomyces boulardii 250 MG capsule  Commonly known as:  FLORASTOR  Take 1 capsule (250 mg total) by mouth 2 (two) times daily.         Physical Exam: *** Filed Vitals:   01/05/15 1424  BP: 108/53  Pulse: 64  Temp: 98.9 F (37.2  C)  TempSrc: Oral  Resp: 18  Height:  (1.6 m)  Weight: 168 lb (76.204 kg)  SpO2: 92%    General- elderly female, well built, in no acute distress Head- normocephalic, atraumatic Ears- left ear normal tympanic membrane and normal external ear canal , right ear normal tympanic membrane and normal external ear canal Nose- normal nasal mucosa, no maxillary or frontal sinus tenderness, no nasal discharge Throat- moist mucus membrane, normal oropharynx, dentition is  Eyes- PERRLA, EOMI, no pallor, no icterus, no discharge, normal conjunctiva, normal sclera Neck- no cervical lymphadenopathy, no supraclavicular lymphadenopathy, no thyromegaly, no jugular vein distension, no carotid bruit Chest- no chest wall deformities, no chest wall tenderness Breast- normal appearance, no masses or lumps on palpation, normal nipple and areola exam, no axillary lymphadenopathy Cardiovascular- normal s1,s2, no murmurs/ rubs/ gallops, dorsalis pedis and radial pulses, leg edema Respiratory- bilateral clear to auscultation, no wheeze, no rhonchi, no crackles, no use of accessory muscles Abdomen- bowel sounds present, soft, non tender, no organomegaly, no abdominal bruits, no guarding or rigidity, no CVA tenderness Pelvic exam- normal pelvic exam, no adenexal or cervical motion tenderness Musculoskeletal- able to move all 4 extremities, no spinal and paraspinal tenderness, normal back curvature, steady gait, no use of assistive device, normal range of motion Neurological- no focal deficit, alert and oriented to person, place and time, normal reflexes, normal muscle strength, normal sensation to fine touch and vibration Skin- warm and dry Nails- hypertrophy, ingrown Psychiatry- normal mood and affect    Labs reviewed: Basic Metabolic Panel:  Recent Labs  16/10/96 0020  12/29/14 0432 12/30/14 0313 12/31/14 1046 01/01/15 1135  NA  --   < > 138 138  --  138  K  --   < > 3.4* 3.7 4.0 3.7  CL  --   < >  107 108  --  105  CO2  --   < > 20* 19*  --  23  GLUCOSE  --   < > 112* 110*  --  125*  BUN  --   < > 16 16  --  10  CREATININE  --   < > 0.89 0.76  --  0.82  CALCIUM  --   < > 8.0* 8.3*  --  8.6*  MG 2.4  --   --   --  1.1* 2.1  PHOS 4.6  --   --   --   --   --   < > = values in this interval not displayed. Liver Function Tests:  Recent Labs  12/25/14 0140 12/26/14 0322 12/27/14 0354  AST 36  31 24  ALT 15 15 14   ALKPHOS 176* 125 96  BILITOT 0.8 0.4 0.5  PROT 4.7* 5.0* 5.0*  ALBUMIN 2.2* 2.2* 2.1*    Recent Labs  12/24/14 0020  12/24/14 0650 12/24/14 0917 12/25/14 0140 12/26/14 0322  LIPASE  --   < > 19 19 18 20   AMYLASE 1154*  --  579* 535*  --   --   < > = values in this interval not displayed. No results for input(s): AMMONIA in the last 8760 hours. CBC:  Recent Labs  10/09/14 0803  12/23/14 1312  12/24/14 0420  12/30/14 0313 12/31/14 0120 01/01/15 0401  WBC 9.1  < > 22.3*  --  14.8*  < > 9.2 8.7 11.8*  NEUTROABS 5.9  --  19.1*  --  13.0*  --   --   --   --   HGB 12.3  < > 11.1*  < > 8.0*  < > 8.9* 9.1* 9.1*  HCT 39.0  < > 36.9  < > 25.4*  < > 28.3* 29.1* 28.1*  MCV 94.4  < > 90.9  --  89.4  < > 85.0 85.3 84.9  PLT 344  < > 633*  --  357  < > 291 362 303  < > = values in this interval not displayed. Cardiac Enzymes:  Recent Labs  10/09/14 1541 10/09/14 2004 10/10/14 0140  TROPONINI 0.04* 0.04* 0.04*   BNP: Invalid input(s): POCBNP CBG: No results for input(s): GLUCAP in the last 8760 hours.  Radiological Exams: Ct Abdomen Pelvis Wo Contrast  12/23/2014  CLINICAL DATA:  Lower abdominal pain.  Constipation for 3 days EXAM: CT ABDOMEN AND PELVIS WITHOUT CONTRAST TECHNIQUE: Multidetector CT imaging of the abdomen and pelvis was performed following the standard protocol without IV contrast. COMPARISON:  None. FINDINGS: Lower chest: Lung bases are clear. Hepatobiliary: No focal hepatic lesion. Gallbladder is not identified. No biliary duct dilatation  Pancreas: Pancreas is normal. No ductal dilatation. No pancreatic inflammation. Spleen: Normal spleen Adrenals/urinary tract: Adrenal glands and kidneys are normal. The ureters and bladder normal. Stomach/Bowel: Stomach is mildly distended with fluid. There is fluid in the duodenum. The proximal small bowel is normal caliber. The distal small bowel is likewise normal caliber. The cecum is fluid-filled. Appendix not identified. Transverse colon is normal caliber. There is fluid stool in the descending colon.several diverticula of the sigmoid colon without acute inflammation. Rectum is normal. Vascular/Lymphatic: Abdominal aorta is normal caliber with atherosclerotic calcification. There is no retroperitoneal or periportal lymphadenopathy. No pelvic lymphadenopathy. Reproductive: Post hysterectomy. Musculoskeletal: Severe degenerative change of the lumbar spine without acute findings Other: No free fluid in the abdomen or pelvis. IMPRESSION: 1. Fluid stool throughout the colon suggests diarrheal disease. 2. No clear evidence of bowel obstruction. No oral contrast administered. 3. Severe atherosclerotic calcification of the aorta. 4. Severe degenerative changes lumbar spine. Electronically Signed   By: Genevive BiStewart  Edmunds M.D.   On: 12/23/2014 15:34    EKG: Independently reviewed. ***  Assessment/Plan No problem-specific assessment & plan notes found for this encounter.      Goals of care: short term rehabilitation   Labs/tests ordered:  Family/ staff Communication: reviewed care plan with patient and nursing supervisor    Oneal GroutMAHIMA PANDEY, MD  [facility] [phone] (Monday-Friday 8 am - 5 pm) [phone] (afterhours)

## 2015-01-09 ENCOUNTER — Non-Acute Institutional Stay (SKILLED_NURSING_FACILITY): Payer: Medicare Other | Admitting: Internal Medicine

## 2015-01-09 DIAGNOSIS — D62 Acute posthemorrhagic anemia: Secondary | ICD-10-CM | POA: Diagnosis not present

## 2015-01-09 DIAGNOSIS — F411 Generalized anxiety disorder: Secondary | ICD-10-CM | POA: Diagnosis not present

## 2015-01-09 DIAGNOSIS — E785 Hyperlipidemia, unspecified: Secondary | ICD-10-CM

## 2015-01-09 DIAGNOSIS — K219 Gastro-esophageal reflux disease without esophagitis: Secondary | ICD-10-CM

## 2015-01-09 DIAGNOSIS — K529 Noninfective gastroenteritis and colitis, unspecified: Secondary | ICD-10-CM | POA: Diagnosis not present

## 2015-01-09 DIAGNOSIS — R197 Diarrhea, unspecified: Secondary | ICD-10-CM

## 2015-01-09 DIAGNOSIS — E46 Unspecified protein-calorie malnutrition: Secondary | ICD-10-CM | POA: Diagnosis not present

## 2015-01-09 DIAGNOSIS — I482 Chronic atrial fibrillation, unspecified: Secondary | ICD-10-CM

## 2015-01-09 DIAGNOSIS — R5381 Other malaise: Secondary | ICD-10-CM | POA: Diagnosis not present

## 2015-01-09 DIAGNOSIS — R600 Localized edema: Secondary | ICD-10-CM | POA: Diagnosis not present

## 2015-01-09 NOTE — Progress Notes (Signed)
This encounter was created in error - please disregard.

## 2015-01-09 NOTE — Progress Notes (Signed)
Patient ID: Penny Ramos, female   DOB: 1927-05-24, 81 y.o.   MRN: [id number]     [facility]   PCP: No primary care provider on file.  Code Status: full code  Allergies  Allergen Reactions  . Strawberry Extract Anaphylaxis  . Nsaids Nausea And Vomiting    Chief Complaint  Patient presents with  . New Admit To SNF     HPI:  81 y.o. patient is here for short term rehabilitation post hospital admission from 12/23/14-01/01/15 with enterobacter sepsis, enteritis, afib with RVR, blood loss anemia and acute renal failure. She required iv fluids, iv antibiotics, amiodarone drip and 2 u prbc transfusion. She is seen in her room today with her caregiver present. She feels weak and tired but feels that her energy is returning slowly. Appetite has been good. She has some dyspnea with exertion. She has been having loose stool. Denies any blood in stool or abdominal cramps.   Review of Systems:  Constitutional: Positive for easy fatigue. Negative for fever, chills, diaphoresis.  HENT: Negative for headache, congestion, difficulty swallowing.   Eyes: Negative for eye pain, blurred vision, double vision and discharge.  Respiratory: Negative for cough, wheezing.  Positive for dyspnea with exertion. Cardiovascular: Negative for chest pain, palpitations. Positive for leg swelling.  Gastrointestinal: Negative for heartburn, nausea, vomiting, abdominal pain. 3 bowel movement since last evening Genitourinary: Negative for dysuria,flank pain.  Musculoskeletal: Negative for back pain, falls Skin: Negative for itching, rash.  Neurological: Negative for dizziness, tingling, focal weakness Psychiatric/Behavioral: Negative for depression.    Past Medical History  Diagnosis Date  . Hypertension   . High cholesterol   . Atrial fibrillation [facility])    Past Surgical History  Procedure Laterality Date  . Cholecystectomy    . Abdominal hysterectomy    . Back surgery     . Ankle surgery    . Appendectomy    . Cardioversion N/A 10/31/2014    Procedure: CARDIOVERSION;  Surgeon: [name], MD;  Location: [facility] ENDOSCOPY;  Service: Cardiovascular;  Laterality: N/A;   Social History:   reports that she has quit smoking. She does not have any smokeless tobacco history on file. She reports that she does not drink alcohol or use illicit drugs.  No family history on file.  Medications:   Medication List       This list is accurate as of: 01/09/15 12:39 PM.  Always use your most recent med list.               acetaminophen 500 MG tablet  Commonly known as:  TYLENOL  Take 500-1,000 mg by mouth every 6 (six) hours as needed for mild pain, moderate pain, fever or headache.     amiodarone 200 MG tablet  Commonly known as:  PACERONE  Take 2 tablets (400 mg total) by mouth 2 (two) times daily.     atorvastatin 10 MG tablet  Commonly known as:  LIPITOR  Take 10 mg by mouth daily.     CHLORASEPTIC 1.4 % Liqd  Generic drug:  phenol  Use as directed 1 spray in the mouth or throat as needed for throat irritation / pain.     clorazepate 7.5 MG tablet  Commonly known as:  TRANXENE  Take 7.5 mg by mouth 2 (two) times daily.     diltiazem 180 MG 24 hr capsule  Commonly known as:  CARDIZEM CD  Take 1 capsule (180 mg total) by mouth daily.  loperamide 2 MG capsule  Commonly known as:  IMODIUM  Take 1 capsule (2 mg total) by mouth as needed for diarrhea or loose stools.     metoprolol 50 MG tablet  Commonly known as:  LOPRESSOR  Take 0.5 tablets (25 mg total) by mouth 4 (four) times daily.     omeprazole 20 MG capsule  Commonly known as:  PRILOSEC  Take 20 mg by mouth daily.     Rivaroxaban 15 MG Tabs tablet  Commonly known as:  XARELTO  Take 1 tablet (15 mg total) by mouth daily with supper.     saccharomyces boulardii 250 MG capsule  Commonly known as:  FLORASTOR  Take 1 capsule (250 mg total) by mouth 2 (two) times daily.         Physical  Exam: Filed Vitals:   01/09/15 1237  BP: 108/53  Pulse: 64  Temp: 98.9 F (37.2 C)  Resp: 18  SpO2: 92%    General- elderly female, well built, in no acute distress Head- normocephalic, atraumatic Nose- normal nasal mucosa, no maxillary or frontal sinus tenderness, no nasal discharge Throat- moist mucus membrane Eyes- PERRLA, EOMI, no pallor, no icterus, no discharge, normal conjunctiva, normal sclera Neck- no cervical lymphadenopathy, no JVD Cardiovascular- normal s1,s2, no murmurs, palpable dorsalis pedis and radial pulses, 1+ pitting leg edema Respiratory- bilateral clear to auscultation, no wheeze, no rhonchi, no crackles, no use of accessory muscles Abdomen- bowel sounds present, soft, non tender Musculoskeletal- able to move all 4 extremities, generalized weakness  Neurological- no focal deficit, alert and oriented to person, place and time Skin- warm and dry Psychiatry- normal mood and affect    Labs reviewed: Basic Metabolic Panel:  Recent Labs  54/10/8108/30/16 0020  12/29/14 0432 12/30/14 0313 12/31/14 1046 01/01/15 1135  NA  --   < > 138 138  --  138  K  --   < > 3.4* 3.7 4.0 3.7  CL  --   < > 107 108  --  105  CO2  --   < > 20* 19*  --  23  GLUCOSE  --   < > 112* 110*  --  125*  BUN  --   < > 16 16  --  10  CREATININE  --   < > 0.89 0.76  --  0.82  CALCIUM  --   < > 8.0* 8.3*  --  8.6*  MG 2.4  --   --   --  1.1* 2.1  PHOS 4.6  --   --   --   --   --   < > = values in this interval not displayed. Liver Function Tests:  Recent Labs  12/25/14 0140 12/26/14 0322 12/27/14 0354  AST 36 31 24  ALT 15 15 14   ALKPHOS 176* 125 96  BILITOT 0.8 0.4 0.5  PROT 4.7* 5.0* 5.0*  ALBUMIN 2.2* 2.2* 2.1*    Recent Labs  12/24/14 0020  12/24/14 0650 12/24/14 0917 12/25/14 0140 12/26/14 0322  LIPASE  --   < > 19 19 18 20   AMYLASE 1154*  --  579* 535*  --   --   < > = values in this interval not displayed. No results for input(s): AMMONIA in the last 8760  hours. CBC:  Recent Labs  10/09/14 0803  12/23/14 1312  12/24/14 0420  12/30/14 0313 12/31/14 0120 01/01/15 0401  WBC 9.1  < > 22.3*  --  14.8*  < >  9.2 8.7 11.8*  NEUTROABS 5.9  --  19.1*  --  13.0*  --   --   --   --   HGB 12.3  < > 11.1*  < > 8.0*  < > 8.9* 9.1* 9.1*  HCT 39.0  < > 36.9  < > 25.4*  < > 28.3* 29.1* 28.1*  MCV 94.4  < > 90.9  --  89.4  < > 85.0 85.3 84.9  PLT 344  < > 633*  --  357  < > 291 362 303  < > = values in this interval not displayed. Cardiac Enzymes:  Recent Labs  10/09/14 1541 10/09/14 2004 10/10/14 0140  TROPONINI 0.04* 0.04* 0.04*   01/05/15 wbc 8, hb 8.4, hct 28.4, mcv 89.9, na 142, k 3.9, bun 15, cr 0.89, gfr > 60    Radiological Exams: Ct Abdomen Pelvis Wo Contrast  12/23/2014  CLINICAL DATA:  Lower abdominal pain.  Constipation for 3 days EXAM: CT ABDOMEN AND PELVIS WITHOUT CONTRAST TECHNIQUE: Multidetector CT imaging of the abdomen and pelvis was performed following the standard protocol without IV contrast. COMPARISON:  None. FINDINGS: Lower chest: Lung bases are clear. Hepatobiliary: No focal hepatic lesion. Gallbladder is not identified. No biliary duct dilatation Pancreas: Pancreas is normal. No ductal dilatation. No pancreatic inflammation. Spleen: Normal spleen Adrenals/urinary tract: Adrenal glands and kidneys are normal. The ureters and bladder normal. Stomach/Bowel: Stomach is mildly distended with fluid. There is fluid in the duodenum. The proximal small bowel is normal caliber. The distal small bowel is likewise normal caliber. The cecum is fluid-filled. Appendix not identified. Transverse colon is normal caliber. There is fluid stool in the descending colon.several diverticula of the sigmoid colon without acute inflammation. Rectum is normal. Vascular/Lymphatic: Abdominal aorta is normal caliber with atherosclerotic calcification. There is no retroperitoneal or periportal lymphadenopathy. No pelvic lymphadenopathy. Reproductive: Post  hysterectomy. Musculoskeletal: Severe degenerative change of the lumbar spine without acute findings Other: No free fluid in the abdomen or pelvis. IMPRESSION: 1. Fluid stool throughout the colon suggests diarrheal disease. 2. No clear evidence of bowel obstruction. No oral contrast administered. 3. Severe atherosclerotic calcification of the aorta. 4. Severe degenerative changes lumbar spine. Electronically Signed   By: Genevive Bi M.D.   On: 12/23/2014 15:34    Assessment/Plan  Physical deconditioning Post enteritis and sepsis. Will have her work with physical therapy and occupational therapy team to help with gait training and muscle strengthening exercises.fall precautions. Skin care. Encourage to be out of bed.   Enteritis Has completed her course of ciprofloxacin and flagyl for now. Afebrile. Monitor clinically  Diarrhea Possible antibiotic associated. Continue florastor 250 mg bid, encouraged to take a cup of yoghurt a day. If persists or worsens, send stool for c.diff. Continue imodium daily as needed for now  afib Rate controlled. Continue pacerone 400 mg bid, cardizem 180 mg daily and lopressor 25 mg qid. Continue xarelto for anticoagulation. With her blood pressure being soft, will have holding parameter on her lopressor and have her vitals be checked q shift for now  Anemia S/p 2 u prbc transfusion in hospital. Her gi bleed, acute pancreatitis and acute enteritis could all have contributed to this. Recent blood work shows some drop in Hb. Denies melena or rectal bleed. Start ferrous sulfate 325 mg bid for now and check cbc 01/15/15 to assess for further drop in Hb with her being on xarelto  Leg edema Reviewed echocardiogram from 10/16 which shows normal EF and systolic function.  No signs of fluid overload on lung exam. Continue ted hose for now. Leg elevation at rest. Add lasix 20 mg daily x 3 days with kcl 10 meq daily. Daily weight check. Check BMP on 01/15/15  Protein  calorie malnutrition Monitor po intake and weight. Continue procel supplement  Leukocytosis Likely from enteritis, has resolved, monitor clinically  gerd Continue prilosec 20 mg daily for now  HLD Continue lipitor 10 mg daily  Anxiety disorder Continue clorazepate 7.5 mg bid for now and monitor     Goals of care: short term rehabilitation   Labs/tests ordered: cbc with diff, bmp 01/15/15  Family/ staff Communication: reviewed care plan with patient and nursing supervisor    Oneal Grout, MD  [facility] [phone] (Monday-Friday 8 am - 5 pm) [phone] (afterhours)

## 2015-07-18 NOTE — Progress Notes (Signed)
Patient ID: Penny Ramos, female   DOB: 03/11/1927, 82 y.o.   MRN: [id number]    DATE:   01/04/16   MRN:  [id number]  BIRTHDAY: 1928/01/28  Facility:  Nursing Home Location:  [facility]  Nursing Home Room Number: [phone]  LEVEL OF CARE:  [phone]  Contact Information    Name Relation Home Work Mobile   Penny Ramos Son [phone]  [phone]   Penny Ramos,Penny Ramos Daughter   [phone]       Code Status History    Date Active Date Inactive Code Status Order ID Comments User Context   12/23/2014  8:20 PM 01/01/2015  6:52 PM DNR [id number]  Drema Dallas, MD ED   12/23/2014  4:59 PM 12/23/2014  8:20 PM DNR [id number]  Leta Baptist, MD ED   10/21/2014  4:19 PM 10/23/2014  4:12 PM Full Code [id number]  [name], MD ED   10/09/2014  1:52 PM 10/10/2014  5:20 PM Full Code [id number]  [name], MD Inpatient    Questions for Most Recent Historical Code Status (Order [id number])    Question Answer Comment   In the event of cardiac or respiratory ARREST Do not call a "code blue"    In the event of cardiac or respiratory ARREST Do not perform Intubation, CPR, defibrillation or ACLS    In the event of cardiac or respiratory ARREST Use medication by any route, position, wound care, and other measures to relive pain and suffering. May use oxygen, suction and manual treatment of airway obstruction as needed for comfort.        Chief Complaint  Patient presents with  . Hospitalization Follow-up    HISTORY OF PRESENT ILLNESS:  This is an 82 year old female who has been admitted to [facility] on 01/01/15 from  [facility]. She has PMH of hypertension, hyperlipidemia and atrial fibrillation on Xarelto. She was treated for Enterobacter sepsis, enteritis, Afib with RVR, blood loss anemia and acute renal failure. She was vien IV fluids, IV antibiotics. Amiodarone drip and 2 u PRBC.  She has been admitted for a short-term rehabilitation.  PAST MEDICAL HISTORY:  Past  Medical History  Diagnosis Date  . Hypertension   . High cholesterol   . Atrial fibrillation (HCC)      CURRENT MEDICATIONS: Reviewed  Patient's Medications  New Prescriptions   No medications on file  Previous Medications   ACETAMINOPHEN (TYLENOL) 500 MG TABLET    Take 500-1,000 mg by mouth every 6 (six) hours as needed for mild pain, moderate pain, fever or headache.    AMIODARONE (PACERONE) 200 MG TABLET    Take 2 tablets (400 mg total) by mouth 2 (two) times daily.   ATORVASTATIN (LIPITOR) 10 MG TABLET    Take 10 mg by mouth daily.   CLORAZEPATE (TRANXENE) 7.5 MG TABLET    Take 7.5 mg by mouth 2 (two) times daily.    DILTIAZEM (CARDIZEM CD) 180 MG 24 HR CAPSULE    Take 1 capsule (180 mg total) by mouth daily.   LOPERAMIDE (IMODIUM) 2 MG CAPSULE    Take 1 capsule (2 mg total) by mouth as needed for diarrhea or loose stools.   METOPROLOL TARTRATE (LOPRESSOR) 50 MG TABLET    Take 0.5 tablets (25 mg total) by mouth 4 (four) times daily.   OMEPRAZOLE (PRILOSEC) 20 MG CAPSULE    Take 20 mg by mouth daily.   PHENOL (CHLORASEPTIC) 1.4 % LIQD    Use as directed  1 spray in the mouth or throat as needed for throat irritation / pain.   RIVAROXABAN (XARELTO) 15 MG TABS TABLET    Take 1 tablet (15 mg total) by mouth daily with supper.   SACCHAROMYCES BOULARDII (FLORASTOR) 250 MG CAPSULE    Take 1 capsule (250 mg total) by mouth 2 (two) times daily.  Modified Medications   No medications on file  Discontinued Medications   LACTOSE FREE NUTRITION (BOOST) LIQD    Take 237 mLs by mouth daily.     Allergies  Allergen Reactions  . Strawberry Extract Anaphylaxis  . Nsaids Nausea And Vomiting     REVIEW OF SYSTEMS:  GENERAL: no change in appetite, no fatigue, no weight changes, no fever, chills or weakness SKIN: Denies rash, itching, wounds, ulcer sores, or nail abnormality EYES: Denies change in vision, dry eyes, eye pain, itching or discharge EARS: Denies change in hearing, ringing in  ears, or earache NOSE: Denies nasal congestion or epistaxis MOUTH and THROAT: Denies oral discomfort, gingival pain or bleeding, pain from teeth or hoarseness   RESPIRATORY: no cough, SOB, DOE, wheezing, hemoptysis CARDIAC: no chest pain,  or palpitations, +edema GI: no abdominal pain, diarrhea, constipation, nausea or vomiting,+heart burn GU: Denies dysuria, frequency, hematuria, incontinenc, or discharge PSYCHIATRIC: Denies feeling of depression or anxiety. No report of hallucinations, insomnia, paranoia, or agitation   PHYSICAL EXAMINATION  GENERAL APPEARANCE: Well nourished. In no acute distress. Normal body habitus SKIN:  Skin is warm and dry.  HEAD: Normal in size and contour. No evidence of trauma EYES: Lids open and close normally. No blepharitis, entropion or ectropion. PERRL. Conjunctivae are clear and sclerae are white. Lenses are without opacity EARS: Pinnae are normal. Patient hears normal voice tunes of the examiner MOUTH and THROAT: Lips are without lesions. Oral mucosa is moist and without lesions. Tongue is normal in shape, size, and color and without lesions NECK: supple, trachea midline, no neck masses, no thyroid tenderness, no thyromegaly LYMPHATICS: no LAN in the neck, no supraclavicular LAN RESPIRATORY: breathing is even & unlabored, BS CTAB CARDIAC: no murmur,no extra heart sounds, BLE edema 2+ GI: abdomen soft, normal BS, no masses, no tenderness, no hepatomegaly, no splenomegaly EXTREMITIES:  Able to move X 4 extremities PSYCHIATRIC: Alert and oriented X 3. Affect and behavior are appropriate  LABS/RADIOLOGY: Labs reviewed: Basic Metabolic Panel:  Recent Labs  14/78/29 0020  12/29/14 0432 12/30/14 0313 12/31/14 1046 01/01/15 1135  NA  --   < > 138 138  --  138  K  --   < > 3.4* 3.7 4.0 3.7  CL  --   < > 107 108  --  105  CO2  --   < > 20* 19*  --  23  GLUCOSE  --   < > 112* 110*  --  125*  BUN  --   < > 16 16  --  10  CREATININE  --   < > 0.89 0.76   --  0.82  CALCIUM  --   < > 8.0* 8.3*  --  8.6*  MG 2.4  --   --   --  1.1* 2.1  PHOS 4.6  --   --   --   --   --   < > = values in this interval not displayed. Liver Function Tests:  Recent Labs  12/25/14 0140 12/26/14 0322 12/27/14 0354  AST 36 31 24  ALT ALKPHOS 176* 125 96  BILITOT 0.8 0.4 0.5  PROT 4.7* 5.0* 5.0*  ALBUMIN 2.2* 2.2* 2.1*    Recent Labs  12/24/14 0020  12/24/14 0650 12/24/14 0917 12/25/14 0140 12/26/14 0322  LIPASE  --   < > 19 19 18 20   AMYLASE 1154*  --  579* 535*  --   --   < > = values in this interval not displayed.    CBC:  Recent Labs  10/09/14 0803  12/23/14 1312  12/24/14 0420  12/30/14 0313 12/31/14 0120 01/01/15 0401  WBC 9.1  < > 22.3*  --  14.8*  < > 9.2 8.7 11.8*  NEUTROABS 5.9  --  19.1*  --  13.0*  --   --   --   --   HGB 12.3  < > 11.1*  < > 8.0*  < > 8.9* 9.1* 9.1*  HCT 39.0  < > 36.9  < > 25.4*  < > 28.3* 29.1* 28.1*  MCV 94.4  < > 90.9  --  89.4  < > 85.0 85.3 84.9  PLT 344  < > 633*  --  357  < > 291 362 303  < > = values in this interval not displayed.  Lipid Panel:  Recent Labs  12/24/14 0420  HDL 46   Cardiac Enzymes:  Recent Labs  10/09/14 1541 10/09/14 2004 10/10/14 0140  TROPONINI 0.04* 0.04* 0.04*     ASSESSMENT/PLAN:  Physical deconditioning - for rehabilitation  Enteritis - continue Cipro 500 mg 1 tab by mouth twice a day 5 days, metronidazole 500 mg 1 tab by mouth 3 times a day 5 days and Florastor 250 mg 1 capsule by mouth twice a day  Atrial fibrillation with RVR - rate controlled; continue diltiazem 24-hour ER 180 mg 1 capsule by mouth daily, amiodarone 200 mg 2 tabs by mouth twice a day, metoprolol tartrate 25 mg 1 tab by mouth 4 times a day and Xarelto 15 mg 1 tab by mouth with supper  Anxiety - mood is stable; continue clorazepate 7.5 mg 1 tab by mouth twice a day  Hyperlipidemia - continue atorvastatin 10 mg 1 tab by mouth daily Lab Results  Component Value Date    CHOL 99 12/24/2014   HDL 46 12/24/2014   LDLCALC 36 12/24/2014   TRIG 84 12/24/2014   CHOLHDL 2.2 12/24/2014   Anemia, acute blood loss -  had a recent acute GI bleed; S/P transfusion of 2 units packed RBCs; hemoglobin 9.1; will monitor  GERD - start Prilosec 20 mg 1 tab by mouth daily  Bilateral lower extremity edema - start bilateral knee-high TED hose, on in a.m. and off at at bedtime    Goals of care:  Short-term rehabilitation    Kenard GowerMonina Medina-Vargas, NP [facility] [phone]

## 2017-04-06 IMAGING — CR DG CHEST 1V PORT
1 series · 1 of 1 positions shown · non-contrast
Comparison: November 29, 2012

CLINICAL DATA: Atrial fibrillation with cardiac palpitations

EXAM:
PORTABLE CHEST - 1 VIEW

[AP]
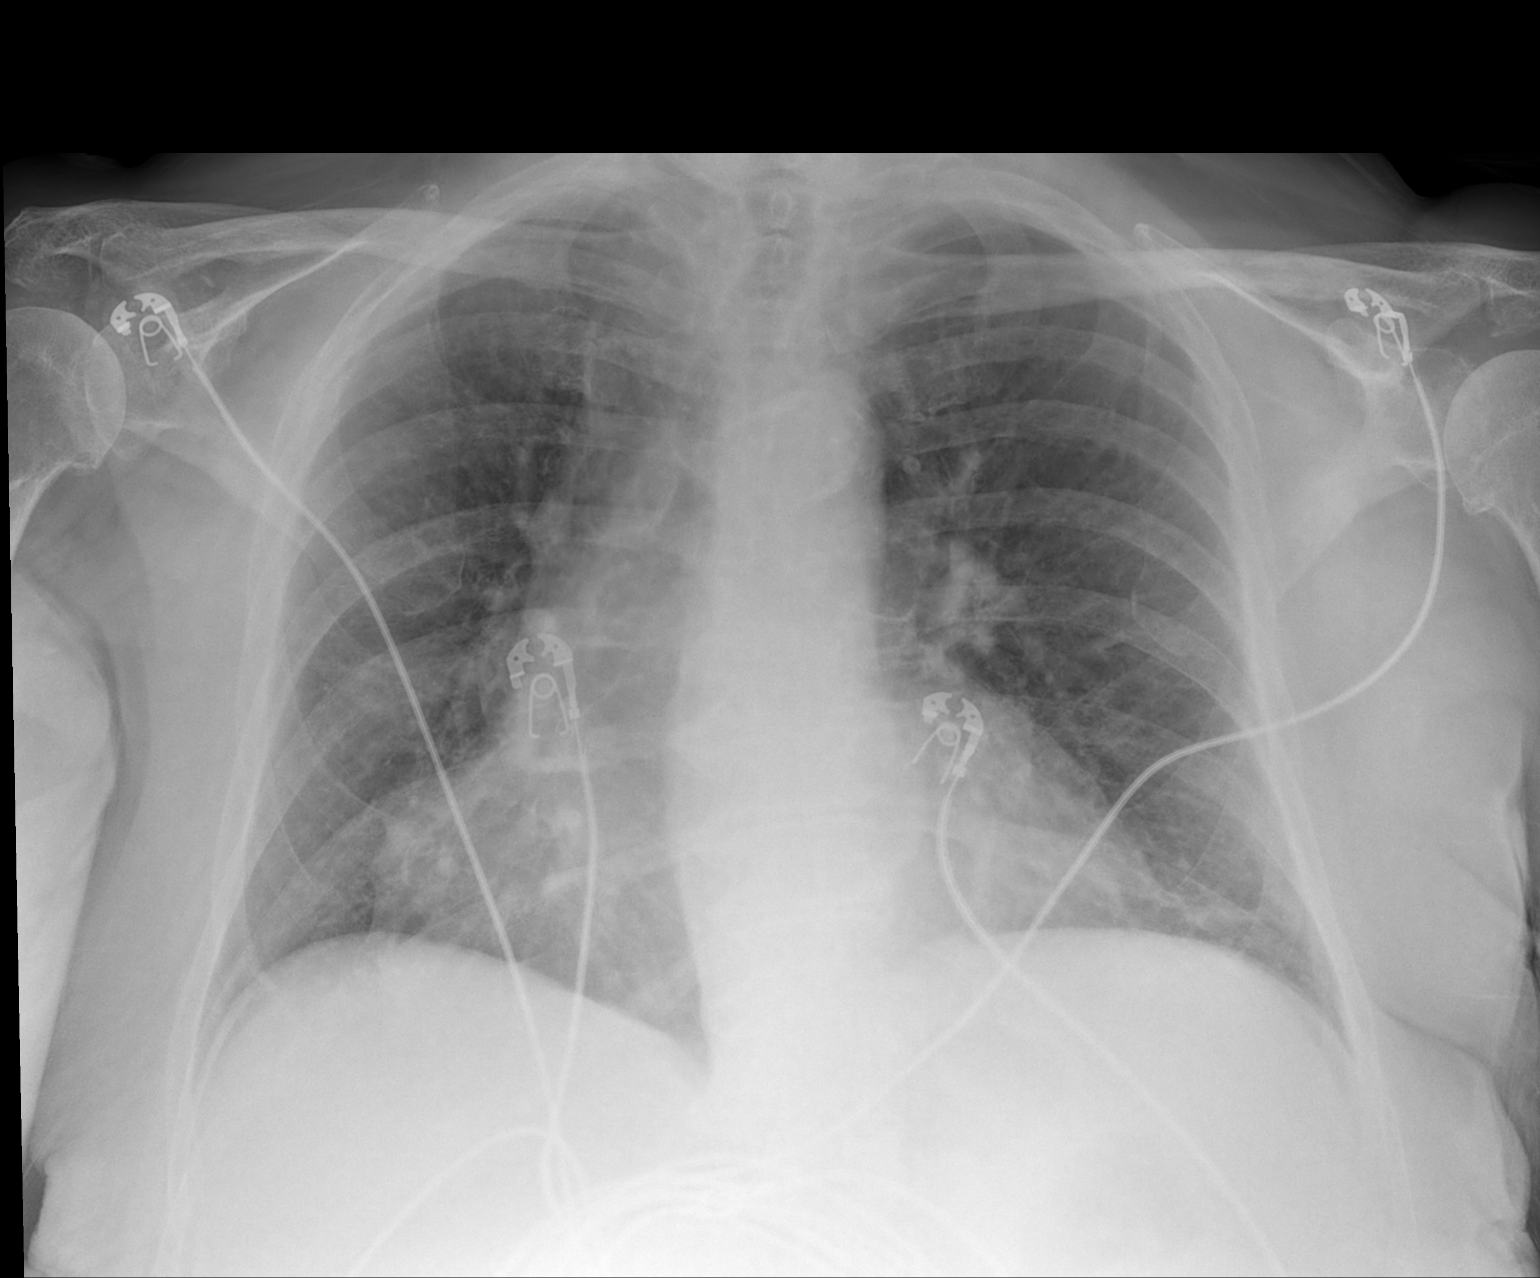

[1 of 1 positions shown; findings below may reference images not displayed]

FINDINGS: There is mild scarring in the left base. There is no edema or
consolidation. The heart is mildly enlarged with pulmonary
vascularity within normal limits. No adenopathy. There is
atherosclerotic change in the aorta. There is calcification in both
carotid arteries.
IMPRESSION: Scarring left base. No edema or consolidation. Stable cardiac
prominence. Calcification noted in each carotid artery.

## 2017-06-20 IMAGING — CT CT ABD-PELV W/O CM
2 of 4 series · 17 of 46 positions shown, 19 images · non-contrast
Comparison: None.

CLINICAL DATA: Lower abdominal pain.  Constipation for 3 days

EXAM:
CT ABDOMEN AND PELVIS WITHOUT CONTRAST
TECHNIQUE: Multidetector CT imaging of the abdomen and pelvis was performed
following the standard protocol without IV contrast.

[Series 2: a/p w/o 5mm · axial · non-contrast · 0.77mm/px · z∈[+752,+1172]mm · 14 of 92 slices shown, 16 images]
[im 4/92  soft-tissue]
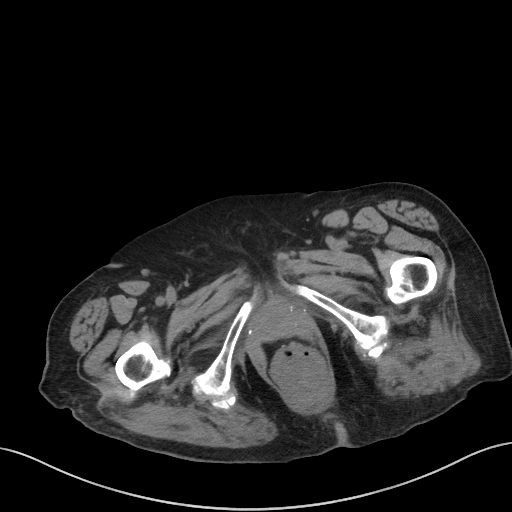
[im 4/92  bone]
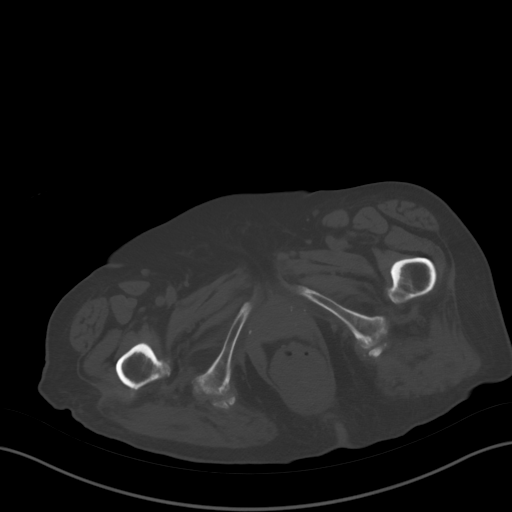
[im 12/92  soft-tissue]
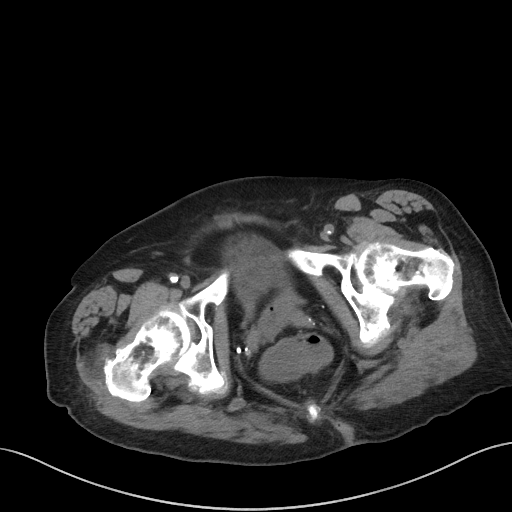
[im 19/92  soft-tissue]
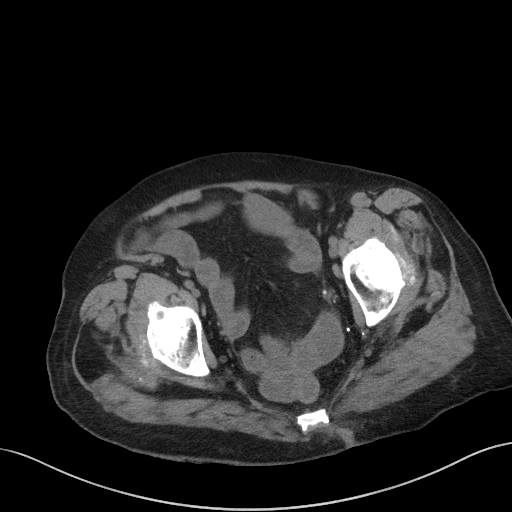
[im 23/92  soft-tissue]
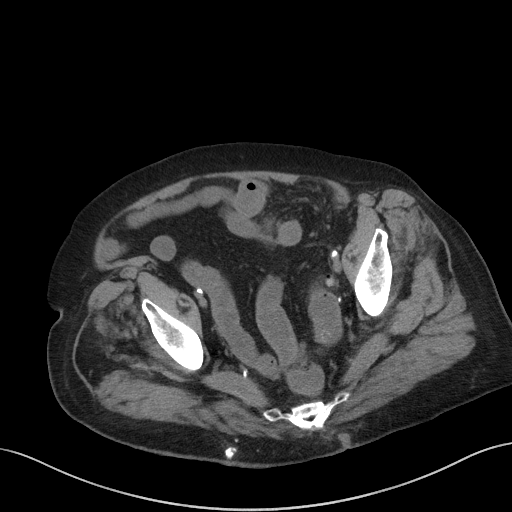
[im 31/92  soft-tissue]
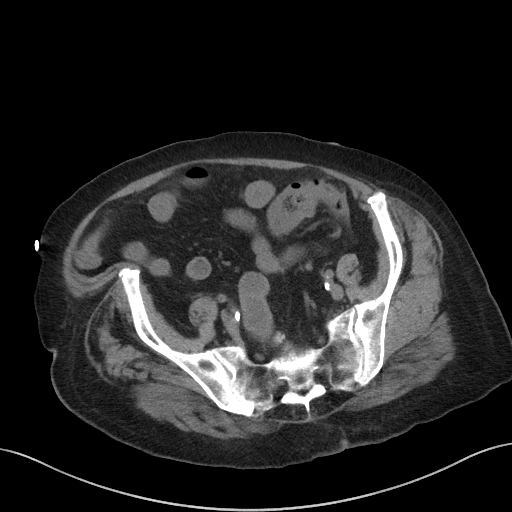
[im 38/92  soft-tissue]
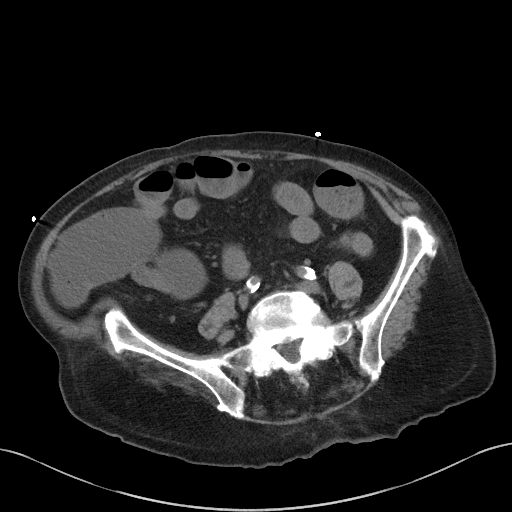
[im 42/92  soft-tissue]
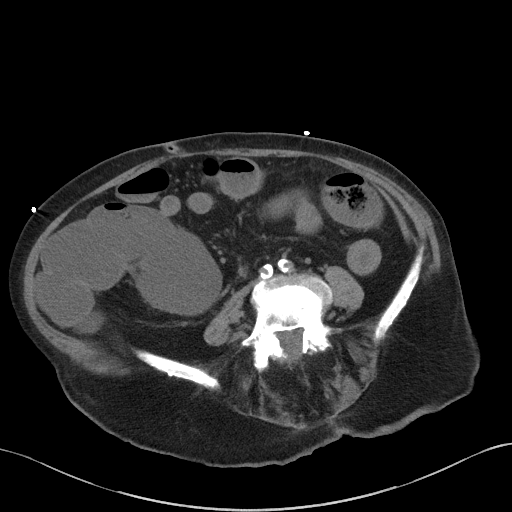
[im 50/92  soft-tissue]
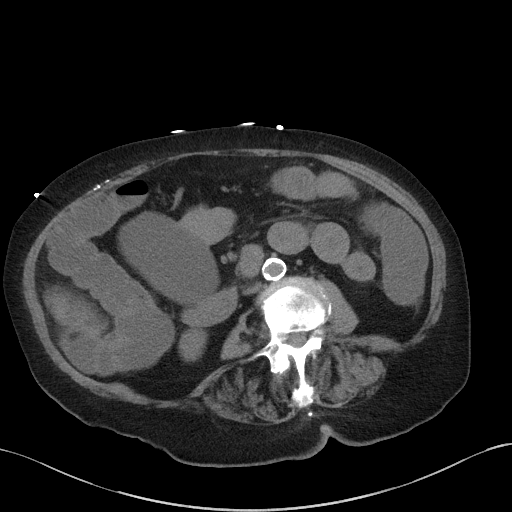
[im 54/92  soft-tissue]
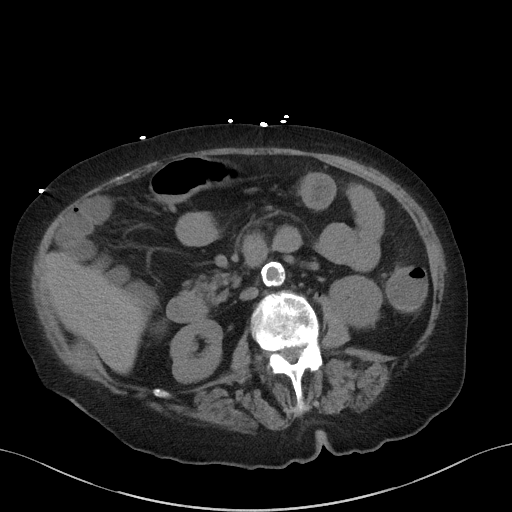
[im 54/92  bone]
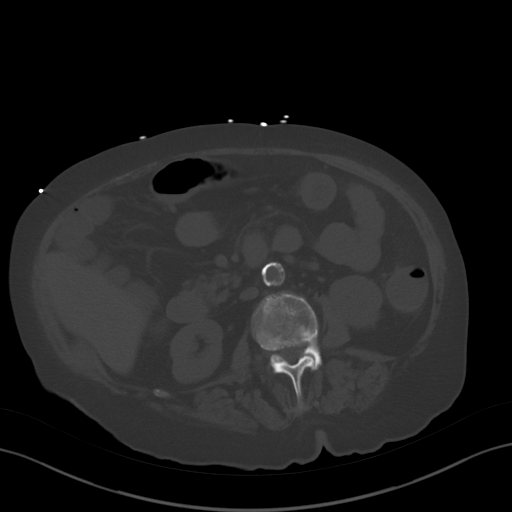
[im 61/92  soft-tissue]
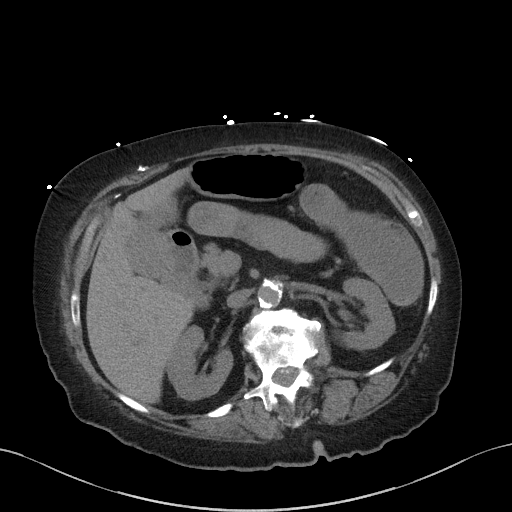
[im 69/92  soft-tissue]
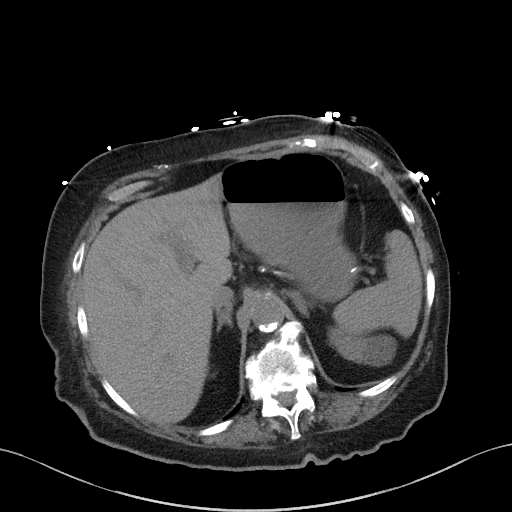
[im 73/92  soft-tissue]
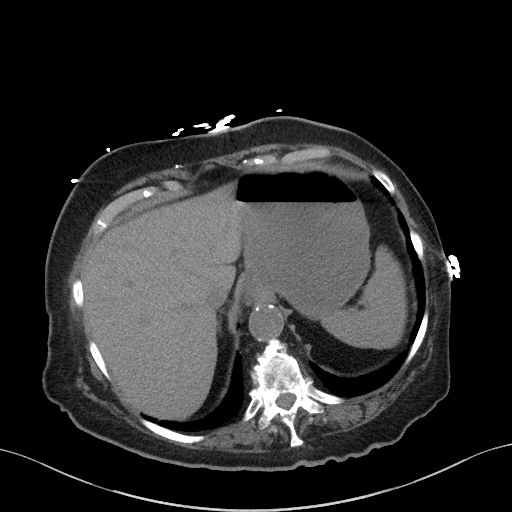
[im 80/92  soft-tissue]
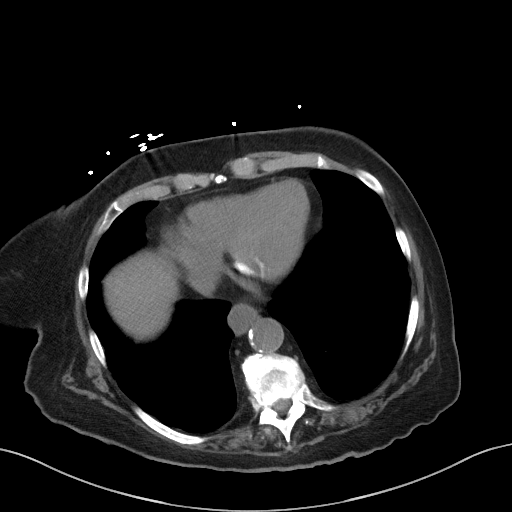
[im 88/92  soft-tissue]
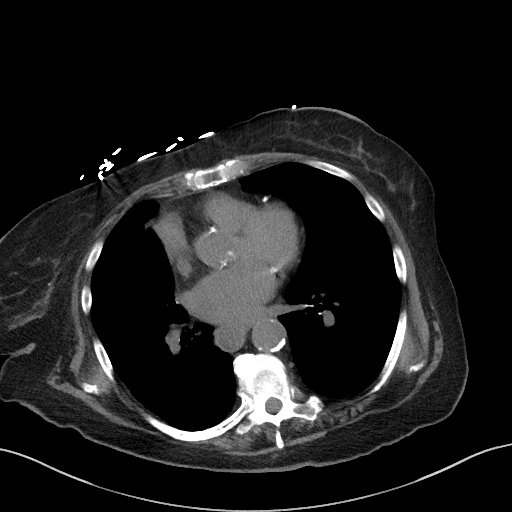

[Series 5: a/p w/o cor · coronal · non-contrast · 0.89mm/px · 3 of 132 slices shown]
[im 44/132  soft-tissue]
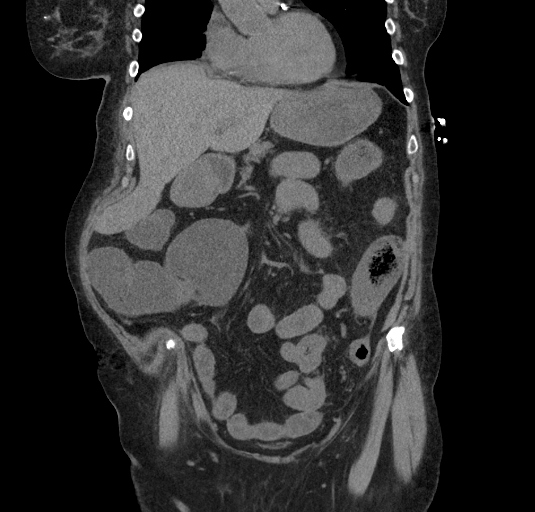
[im 59/132  soft-tissue]
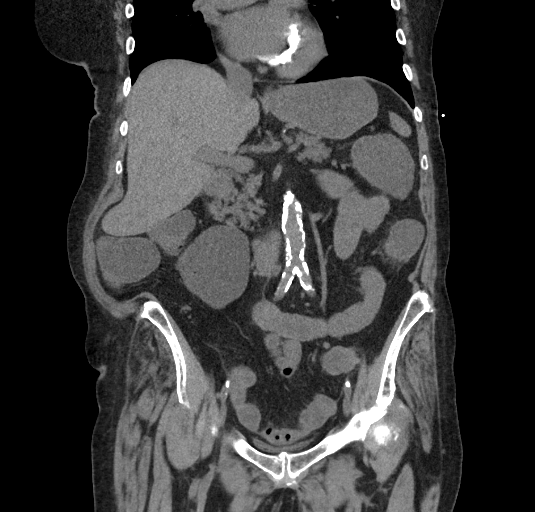
[im 73/132  soft-tissue]
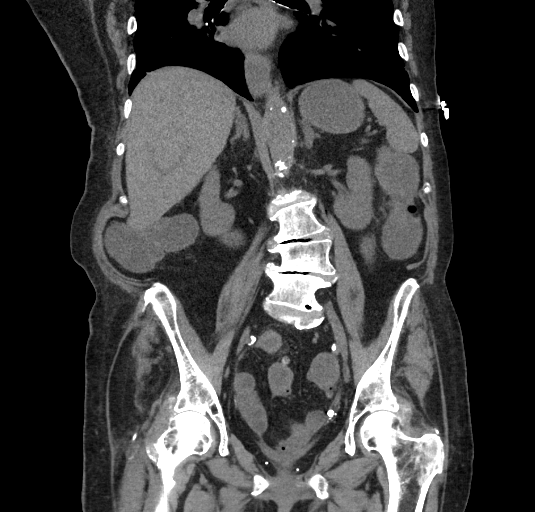

[17 of 46 positions shown; findings below may reference images not displayed]

FINDINGS: Lower chest: Lung bases are clear.

Hepatobiliary: No focal hepatic lesion. Gallbladder is not
identified. No biliary duct dilatation

Pancreas: Pancreas is normal. No ductal dilatation. No pancreatic
inflammation.

Spleen: Normal spleen

Adrenals/urinary tract: Adrenal glands and kidneys are normal. The
ureters and bladder normal.

Stomach/Bowel: Stomach is mildly distended with fluid. There is
fluid in the duodenum. The proximal small bowel is normal caliber.
The distal small bowel is likewise normal caliber. The cecum is
fluid-filled. Appendix not identified. Transverse colon is normal
caliber. There is fluid stool in the descending colon.several
diverticula of the sigmoid colon without acute inflammation. Rectum
is normal.

Vascular/Lymphatic: Abdominal aorta is normal caliber with
atherosclerotic calcification. There is no retroperitoneal or
periportal lymphadenopathy. No pelvic lymphadenopathy.

Reproductive: Post hysterectomy.

Musculoskeletal: Severe degenerative change of the lumbar spine
without acute findings

Other: No free fluid in the abdomen or pelvis.
IMPRESSION: 1. Fluid stool throughout the colon suggests diarrheal disease.
2. No clear evidence of bowel obstruction. No oral contrast
administered.
3. Severe atherosclerotic calcification of the aorta.
4. Severe degenerative changes lumbar spine.

## 2019-12-26 DEATH — deceased
